# Patient Record
Sex: Female | Born: 1985 | Race: White | Hispanic: No | Marital: Married | Smoking: Never smoker
Health system: Southern US, Community
[De-identification: ages and names within clinical notes are randomized; demographics above are authoritative.]

## PROBLEM LIST (undated history)

## (undated) DIAGNOSIS — F32A Depression, unspecified: Secondary | ICD-10-CM

## (undated) DIAGNOSIS — L709 Acne, unspecified: Secondary | ICD-10-CM

## (undated) DIAGNOSIS — Z23 Encounter for immunization: Secondary | ICD-10-CM

## (undated) DIAGNOSIS — IMO0001 Reserved for inherently not codable concepts without codable children: Secondary | ICD-10-CM

## (undated) DIAGNOSIS — F988 Other specified behavioral and emotional disorders with onset usually occurring in childhood and adolescence: Secondary | ICD-10-CM

## (undated) DIAGNOSIS — O139 Gestational [pregnancy-induced] hypertension without significant proteinuria, unspecified trimester: Secondary | ICD-10-CM

## (undated) DIAGNOSIS — F329 Major depressive disorder, single episode, unspecified: Secondary | ICD-10-CM

## (undated) DIAGNOSIS — M5124 Other intervertebral disc displacement, thoracic region: Secondary | ICD-10-CM

## (undated) HISTORY — DX: Reserved for inherently not codable concepts without codable children: IMO0001

## (undated) HISTORY — DX: Major depressive disorder, single episode, unspecified: F32.9

## (undated) HISTORY — DX: Other specified behavioral and emotional disorders with onset usually occurring in childhood and adolescence: F98.8

## (undated) HISTORY — DX: Encounter for immunization: Z23

## (undated) HISTORY — PX: NO PAST SURGERIES: SHX2092

## (undated) HISTORY — DX: Acne, unspecified: L70.9

## (undated) HISTORY — DX: Other intervertebral disc displacement, thoracic region: M51.24

## (undated) HISTORY — DX: Depression, unspecified: F32.A

---

## 2005-11-02 DIAGNOSIS — M5124 Other intervertebral disc displacement, thoracic region: Secondary | ICD-10-CM

## 2005-11-02 HISTORY — DX: Other intervertebral disc displacement, thoracic region: M51.24

## 2006-01-18 ENCOUNTER — Ambulatory Visit: Payer: Self-pay | Admitting: Unknown Physician Specialty

## 2010-04-11 ENCOUNTER — Ambulatory Visit: Payer: Self-pay | Admitting: Sports Medicine

## 2012-02-01 ENCOUNTER — Encounter (HOSPITAL_COMMUNITY): Payer: Self-pay | Admitting: Emergency Medicine

## 2012-02-01 ENCOUNTER — Emergency Department (HOSPITAL_COMMUNITY)
Admission: EM | Admit: 2012-02-01 | Discharge: 2012-02-02 | Disposition: A | Discharge status: Home / Self Care | Payer: 59 | Attending: Emergency Medicine | Admitting: Emergency Medicine

## 2012-02-01 DIAGNOSIS — L255 Unspecified contact dermatitis due to plants, except food: Secondary | ICD-10-CM

## 2012-02-01 DIAGNOSIS — L259 Unspecified contact dermatitis, unspecified cause: Secondary | ICD-10-CM | POA: Insufficient documentation

## 2012-02-01 NOTE — ED Notes (Signed)
PT. REPORTS ITCHY RASH AT RIGHT FACE AND RIGHT HAND ONSET YESTERDAY AFTER WORKING AT [employer]

## 2012-02-02 MED ORDER — PREDNISONE 20 MG PO TABS
60.0000 mg | ORAL_TABLET | Freq: Once | ORAL | Status: AC
  Administered 2012-02-02: 60 mg via ORAL
  Filled 2012-02-02: qty 3

## 2012-02-02 MED ORDER — PREDNISONE 20 MG PO TABS: 40.0000 mg | ORAL_TABLET | Freq: Every day | ORAL | Status: AC

## 2012-02-02 MED ORDER — DIPHENHYDRAMINE HCL 25 MG PO CAPS
50.0000 mg | ORAL_CAPSULE | Freq: Once | ORAL | Status: AC
  Administered 2012-02-02: 50 mg via ORAL
  Filled 2012-02-02: qty 2

## 2012-02-02 MED ORDER — DIPHENHYDRAMINE HCL 50 MG/ML IJ SOLN: 50.0000 mg | Freq: Once | INTRAMUSCULAR | Status: DC

## 2012-02-02 MED ORDER — FAMOTIDINE 20 MG PO TABS
20.0000 mg | ORAL_TABLET | Freq: Once | ORAL | Status: AC
  Administered 2012-02-02: 20 mg via ORAL
  Filled 2012-02-02: qty 1

## 2012-02-02 NOTE — Discharge Instructions (Signed)
Please review the instructions below. You were evaluated in the emergency department tonight for the rash and itching associated with your recent exposure to see the poison oak/ ivy. We have started your treatment here with Prednisone, Pepcid and Benadryl. You'll continue the Prednisone for a week as directed. You will need to get over-the-counter Pepcid and take 20 mg a day twice a day for 5 days. Get over-the-counter Benadryl and take 25-50 and a mg every 4-6 hours as needed for itching. Try to take the Benadryl as regularly as possible over the next 2-3 days. If you develop worsening symptoms, especially shortness of breath,  wheezing or significant swelling please return to emergency department. Otherwise, we have provided the "[facility]" number above and the resource list below to assist you in getting established with a primary care physician.    Contact Dermatitis Contact dermatitis is a reaction to certain substances that touch the skin. Contact dermatitis can be either irritant contact dermatitis or allergic contact dermatitis. Irritant contact dermatitis does not require previous exposure to the substance for a reaction to occur.Allergic contact dermatitis only occurs if you have been exposed to the substance before. Upon a repeat exposure, your body reacts to the substance.  CAUSES  Many substances can cause contact dermatitis. Irritant dermatitis is most commonly caused by repeated exposure to mildly irritating substances, such as:  Makeup.   Soaps.   Detergents.   Bleaches.   Acids.   Metal salts, such as nickel.  Allergic contact dermatitis is most commonly caused by exposure to:  Poisonous plants.   Chemicals (deodorants, shampoos).   Jewelry.   Latex.   Neomycin in triple antibiotic cream.   Preservatives in products, including clothing.  SYMPTOMS  The area of skin that is exposed may develop:  Dryness or flaking.   Redness.   Cracks.   Itching.    Pain or a burning sensation.   Blisters.  With allergic contact dermatitis, there may also be swelling in areas such as the eyelids, mouth, or genitals.  DIAGNOSIS  Your caregiver can usually tell what the problem is by doing a physical exam. In cases where the cause is uncertain and an allergic contact dermatitis is suspected, a patch skin test may be performed to help determine the cause of your dermatitis. TREATMENT Treatment includes protecting the skin from further contact with the irritating substance by avoiding that substance if possible. Barrier creams, powders, and gloves may be helpful. Your caregiver may also recommend:  Steroid creams or ointments applied 2 times daily. For best results, soak the rash area in cool water for 20 minutes. Then apply the medicine. Cover the area with a plastic wrap. You can store the steroid cream in the refrigerator for a "chilly" effect on your rash. That may decrease itching. Oral steroid medicines may be needed in more severe cases.   Antibiotics or antibacterial ointments if a skin infection is present.   Antihistamine lotion or an antihistamine taken by mouth to ease itching.   Lubricants to keep moisture in your skin.   Burow's solution to reduce redness and soreness or to dry a weeping rash. Mix one packet or tablet of solution in 2 cups cool water. Dip a clean washcloth in the mixture, wring it out a bit, and put it on the affected area. Leave the cloth in place for 30 minutes. Do this as often as possible throughout the day.   Taking several cornstarch or baking soda baths daily if the area  is too large to cover with a washcloth.  Harsh chemicals, such as alkalis or acids, can cause skin damage that is like a burn. You should flush your skin for 15 to 20 minutes with cold water after such an exposure. You should also seek immediate medical care after exposure. Bandages (dressings), antibiotics, and pain medicine may be needed for severely  irritated skin.  HOME CARE INSTRUCTIONS  Avoid the substance that caused your reaction.   Keep the area of skin that is affected away from hot water, soap, sunlight, chemicals, acidic substances, or anything else that would irritate your skin.   Do not scratch the rash. Scratching may cause the rash to become infected.   You may take cool baths to help stop the itching.   Only take over-the-counter or prescription medicines as directed by your caregiver.   See your caregiver for follow-up care as directed to make sure your skin is healing properly.  SEEK MEDICAL CARE IF:   Your condition is not better after 3 days of treatment.   You seem to be getting worse.   You see signs of infection such as swelling, tenderness, redness, soreness, or warmth in the affected area.   You have any problems related to your medicines.  Document Released: 10/16/2000 Document Revised: 10/08/2011 Document Reviewed: 03/24/2011 [facility] Patient Information 2012 [place], [place].Poison [facility] is an inflammation of the skin (contact dermatitis). It is caused by contact with the allergens on the leaves of the oak (toxicodendron) plants. Depending on your sensitivity, the rash may consist simply of redness and itching, or it may also progress to blisters which may break open (rupture). These must be well cared for to prevent secondary germ (bacterial) infection as these infections can lead to scarring. The eyes may also get puffy. The puffiness is worst in the morning and gets better as the day progresses. Healing is best accomplished by keeping any open areas dry, clean, covered with a bandage, and covered with an antibacterial ointment if needed. Without secondary infection, this dermatitis usually heals without scarring within 2 to 3 weeks without treatment. HOME CARE INSTRUCTIONS When you have been exposed to poison oak, it is very important to thoroughly wash with soap and water as soon as the exposure  has been discovered. You have about one half hour to remove the plant resin before it will cause the rash. This cleaning will quickly destroy the oil or antigen on the skin (the antigen is what causes the rash). Wash aggressively under the fingernails as any plant resin still there will continue to spread the rash. Do not rub skin vigorously when washing affected area. Poison oak cannot spread if no oil from the plant remains on your body. Rash that has progressed to weeping sores (lesions) will not spread the rash unless you have not washed thoroughly. It is also important to clean any clothes you have been wearing as they may carry active allergens which will spread the rash, even several days later. Avoidance of the plant in the future is the best measure. Poison oak plants can be recognized by the number of leaves. Generally, poison oak has three leaves with flowering branches on a single stem. Diphenhydramine may be purchased over the counter and used as needed for itching. Do not drive with this medication if it makes you drowsy. Ask your caregiver about medication for children. SEEK IMMEDIATE MEDICAL CARE IF:   Open areas of the rash develop.   You notice redness extending  beyond the area of the rash.   There is a pus like discharge.   There is increased pain.   Other signs of infection develop (such as fever).  Document Released: 04/25/2003 Document Revised: 10/08/2011 Document Reviewed: 09/04/2009 [facility] Patient Information 2012 [place], [place].

## 2012-02-02 NOTE — ED Provider Notes (Signed)
Medical screening examination/treatment/procedure(s) were performed by non-physician practitioner and as supervising physician I was immediately available for consultation/collaboration.   Malcome Ambrocio, MD 02/02/12 0843 

## 2012-02-02 NOTE — ED Provider Notes (Signed)
History     CSN: [id number]  Arrival date & time 02/01/12  2206   First MD Initiated Contact with Patient 02/02/12 [phone]      Chief Complaint  Patient presents with  . Rash    HPI: Patient is a 26 y.o. female presenting with rash. The history is provided by the patient.  Rash  This is a new problem. The problem is associated with plant contact. There has been no fever. The rash is present on the face, left hand, left wrist, right wrist and right hand. The pain is at a severity of 0/10. The patient is experiencing no pain. Associated symptoms include itching. Pertinent negatives include no blisters and no weeping. She has tried antihistamines for the symptoms. The treatment provided no relief.  Patient reports that Saturday she was working in a lot of weeds in [place]. Later she was told that the area had lots of poison oak. Sunday morning she started with a fine rash to bilateral hands and right neck. Rash has slowly worsened and now covers most of the right side of the face with mild swelling noted to the right eyelid. States rash is very itchy. Denies shortness of breath for the symptoms. States she has tried Benadryl for the itching but it does not seem to help.  History reviewed. No pertinent past medical history.  History reviewed. No pertinent past surgical history.  No family history on file.  History  Substance Use Topics  . Smoking status: Never Smoker   . Smokeless tobacco: Not on file  . Alcohol Use: Yes    OB History    Grav Para Term Preterm Abortions TAB SAB Ect Mult Living                  Review of Systems  Constitutional: Negative.   HENT: Negative.   Eyes: Negative.   Respiratory: Negative.   Cardiovascular: Negative.   Gastrointestinal: Negative.   Genitourinary: Negative.   Musculoskeletal: Negative.   Skin: Positive for itching and rash.  Neurological: Negative.   Hematological: Negative.   Psychiatric/Behavioral: Negative.     Allergies  Review  of patient's allergies indicates no known allergies.  Home Medications   Current Outpatient Rx  Name Route Sig Dispense Refill  . DIPHENHYDRAMINE HCL 25 MG PO CAPS Oral Take 25 mg by mouth daily as needed. For itching ( poison oak outbreak)      BP 113/74  Pulse 54  Temp(Src) 98.1 F (36.7 C) (Oral)  Resp 16  SpO2 100%  LMP 01/28/2012  Physical Exam  Constitutional: She is oriented to person, place, and time. She appears well-developed and well-nourished.  HENT:  Head: Normocephalic and atraumatic.  Eyes: Conjunctivae are normal.  Neck: Neck supple.  Cardiovascular: Normal rate and regular rhythm.   Pulmonary/Chest: Effort normal and breath sounds normal.  Abdominal: Soft. Bowel sounds are normal.  Musculoskeletal: Normal range of motion.  Neurological: She is alert and oriented to person, place, and time.  Skin: Skin is warm and dry. Rash noted. No erythema.       Noted dense, erythematous, papular rash to face (predominantly right side of the face and right neck w/ mild swelling noted to (R) eyelid) and bil hands.  Psychiatric: She has a normal mood and affect.    ED Course  Procedures   Labs Reviewed - No data to display No results found.   No diagnosis found.    MDM  HPI/PE and clinical findings c/w 1. Contact  dermatitis (Likely urushiol-induced)        Leanne Chang, NP 02/02/12 [phone]

## 2014-02-15 LAB — HM PAP SMEAR: HM PAP: NEGATIVE

## 2016-06-05 ENCOUNTER — Telehealth: Payer: Self-pay | Admitting: Primary Care

## 2016-06-05 ENCOUNTER — Encounter: Payer: Self-pay | Admitting: Primary Care

## 2016-06-05 ENCOUNTER — Ambulatory Visit (INDEPENDENT_AMBULATORY_CARE_PROVIDER_SITE_OTHER): Payer: No Typology Code available for payment source | Admitting: Primary Care

## 2016-06-05 VITALS — BP 124/76 | HR 56 | Temp 98.1°F | Ht 66.5 in | Wt 161.1 lb

## 2016-06-05 DIAGNOSIS — R41840 Attention and concentration deficit: Secondary | ICD-10-CM | POA: Diagnosis not present

## 2016-06-05 DIAGNOSIS — F909 Attention-deficit hyperactivity disorder, unspecified type: Secondary | ICD-10-CM | POA: Insufficient documentation

## 2016-06-05 NOTE — Patient Instructions (Signed)
You will be contacted regarding your referral to Psychology for ADD/ADHD testing.  Please let us know if you have not heard back in 3 weeks.  Once you've been tested please e-mail me so I can get in touch with the psychologist. We will then meet back together to discuss treatment options.  It was a pleasure to meet you today! Please don't hesitate to call me with any questions. Welcome to [facility]!

## 2016-06-05 NOTE — Progress Notes (Signed)
   Subjective:    Patient ID: Lynn Bean, female    DOB: [birth year], 30 y.o.   MRN: [id number]  HPI  Lynn Bean is a 30 year old female who presents today to establish care and discuss the problems mentioned below. Will obtain old records. She will see GYN annually.   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 [employer] will require her to study and take numerous exams.   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 She was once managed on Ritalin in the morning and Focalin in the afternoon. She has been without her medication for the past 6 years as she didn't require it any longer. She's had no recent formal testing.  She struggles with focusing, staying on task, and cannot comprehend what she's studying which will cause frustration. Denies anxiety, daily worry, irritability. She would like to resume medication for the next 1-2 years.  Review of Systems  Respiratory: Negative for shortness of breath.   Cardiovascular: Negative for chest pain and palpitations.  Psychiatric/Behavioral: Positive for decreased concentration. Negative for sleep disturbance. The patient is not nervous/anxious.        Past Medical History:  Diagnosis Date  . Ruptured disc, thoracic 2007     Social History   Social History  . Marital status: Single    Spouse name: N/A  . Number of children: N/A  . Years of education: N/A   Occupational History  . Not on file.   Social History Main Topics  . Smoking status: Never Smoker  . Smokeless tobacco: Not on file  . Alcohol use Yes  . Drug use: No  . Sexual activity: Not on file   Other Topics Concern  . Not on file   Social History Narrative   Single.   No children.   Works for [employer].   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   Graduated from [employer].   Enjoys playing golf, exercising.     No past  surgical history on file.  Family History  Problem Relation Age of Onset  . Lymphoma Maternal Grandfather     No Known Allergies  No current outpatient prescriptions on file prior to visit.   No current facility-administered medications on file prior to visit.     BP 124/76   Pulse (!) 56   Temp 98.1 F (36.7 C) (Oral)   Ht 5' 6.5" (1.689 m)   Wt 161 lb 1.9 oz (73.1 kg)   LMP 05/16/2016   SpO2 98%   BMI 25.62 kg/m    Objective:   Physical Exam  Constitutional: She appears well-nourished.  Neck: Neck supple.  Cardiovascular: Normal rate and regular rhythm.   Pulmonary/Chest: Effort normal and breath sounds normal.  Skin: Skin is warm and dry.  Psychiatric: She has a normal mood and affect.          Assessment & Plan:

## 2016-06-05 NOTE — Assessment & Plan Note (Signed)
History of ADD, inattentive in high school and managed on Ritalin and Focalin. No recent testing or medication. Referral placed for formal testing through psychology. Discussed process with patient who verbalized understanding. Will await testing results.

## 2016-06-05 NOTE — Progress Notes (Signed)
Pre visit review using our clinic review tool, if applicable. No additional management support is needed unless otherwise documented below in the visit note. 

## 2016-06-05 NOTE — Telephone Encounter (Signed)
Pt placed on [facility] WQ. Pt is aware someone will call her to schedule with Dr. Lurline Hare or Dr. Glennon Hamilton

## 2016-07-10 ENCOUNTER — Ambulatory Visit: Payer: No Typology Code available for payment source | Admitting: Psychology

## 2016-08-24 ENCOUNTER — Encounter: Payer: Self-pay | Admitting: Primary Care

## 2016-08-25 ENCOUNTER — Ambulatory Visit (INDEPENDENT_AMBULATORY_CARE_PROVIDER_SITE_OTHER): Payer: No Typology Code available for payment source | Admitting: Primary Care

## 2016-08-25 ENCOUNTER — Encounter: Payer: Self-pay | Admitting: Primary Care

## 2016-08-25 VITALS — BP 122/78 | HR 78 | Temp 98.5°F | Ht 66.5 in | Wt 144.1 lb

## 2016-08-25 DIAGNOSIS — H9201 Otalgia, right ear: Secondary | ICD-10-CM | POA: Diagnosis not present

## 2016-08-25 NOTE — Progress Notes (Signed)
Pre visit review using our clinic review tool, if applicable. No additional management support is needed unless otherwise documented below in the visit note. 

## 2016-08-25 NOTE — Progress Notes (Signed)
   Subjective:    Patient ID: Lynn Bean, female    DOB: July 15, 1986, 30 y.o.   MRN: [id number]  HPI  Lynn Bean is a 30 year old female who presents today with a chief complaint of ear pain. Her pain is located to the right ear which has been present since Wednesday last week. She describes her pain as pressure. The pain has started to radiate down her right jaw and became worse yesterday. Denies cough, sore throat, fevers. She's taken tylenol and Advil without much improvement.   Review of Systems  Constitutional: Negative for chills and fever.  HENT: Positive for ear pain. Negative for congestion, postnasal drip, sinus pressure and sore throat.   Respiratory: Negative for cough.        Past Medical History:  Diagnosis Date  . Ruptured disc, thoracic 2007     Social History   Social History  . Marital status: Single    Spouse name: N/A  . Number of children: N/A  . Years of education: N/A   Occupational History  . Not on file.   Social History Main Topics  . Smoking status: Never Smoker  . Smokeless tobacco: Not on file  . Alcohol use Yes  . Drug use: No  . Sexual activity: Not on file   Other Topics Concern  . Not on file   Social History Narrative   Single.   No children.   Works for [employer].   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   Graduated from [employer].   Enjoys playing golf, exercising.     No past surgical history on file.  Family History  Problem Relation Age of Onset  . Lymphoma Maternal Grandfather     No Known Allergies  No current outpatient prescriptions on file prior to visit.   No current facility-administered medications on file prior to visit.     BP 122/78   Pulse 78   Temp 98.5 F (36.9 C) (Oral)   Ht 5' 6.5" (1.689 m)   Wt 144 lb 1.9 oz (65.4 kg)   LMP 08/02/2016   SpO2 98%   BMI 22.91 kg/m    Objective:   Physical Exam  Constitutional: She appears well-nourished.  HENT:  Right Ear: Ear canal  normal. Tympanic membrane is not erythematous and not bulging.  Left Ear: Ear canal normal. Tympanic membrane is not erythematous and not bulging.  Nose: Right sinus exhibits no maxillary sinus tenderness and no frontal sinus tenderness. Left sinus exhibits no maxillary sinus tenderness and no frontal sinus tenderness.  Mouth/Throat: Oropharynx is clear and moist.  Dullness to TM's bilaterally, mild effusion to right TM. No s/s of acute infection  Eyes: Conjunctivae are normal.  Neck: Neck supple.  Cardiovascular: Normal rate and regular rhythm.   Pulmonary/Chest: Effort normal and breath sounds normal. She has no wheezes. She has no rales.  Lymphadenopathy:    She has no cervical adenopathy.  Skin: Skin is warm and dry.          Assessment & Plan:  Ear Pain:  Located to right ear x 6 days. Little to no improvement with tylenol or ibuprofen. Exam today with dullness bilaterally and mild effusion to right TM. Does not appear acutely infected. ENT exam otherwise unremarkable. Suspect allergy involvement and will treat conservatively.  Will have her start Zyrtec and Flonase, report back if no improvement.  Sheral Flow, NP

## 2016-08-25 NOTE — Patient Instructions (Signed)
There is no evidence of infection or ear wax buildup. You do have fluid behind the ear drum.  Start an antihistamine such as Claritin, Allegra, or Zyrtec. Take this once daily for the next 2-3 weeks.  Try using Flonase (fluticasone) nasal spray. Instill 1 spray in each nostril twice daily.   You may continue Advil as this will help to reduce any inflammation/pain.  Please e-mail me if symptoms progress, if you develop fevers, nausea, sore throat.  It was a pleasure to see you today!

## 2016-08-28 ENCOUNTER — Ambulatory Visit (INDEPENDENT_AMBULATORY_CARE_PROVIDER_SITE_OTHER): Payer: No Typology Code available for payment source | Admitting: Psychology

## 2016-08-28 DIAGNOSIS — F909 Attention-deficit hyperactivity disorder, unspecified type: Secondary | ICD-10-CM

## 2016-08-28 DIAGNOSIS — F902 Attention-deficit hyperactivity disorder, combined type: Secondary | ICD-10-CM

## 2016-08-28 DIAGNOSIS — F4010 Social phobia, unspecified: Secondary | ICD-10-CM

## 2016-08-28 DIAGNOSIS — F4321 Adjustment disorder with depressed mood: Secondary | ICD-10-CM

## 2016-08-28 DIAGNOSIS — F419 Anxiety disorder, unspecified: Secondary | ICD-10-CM

## 2016-09-01 ENCOUNTER — Encounter: Payer: Self-pay | Admitting: Primary Care

## 2016-09-01 ENCOUNTER — Telehealth: Payer: Self-pay | Admitting: Primary Care

## 2016-09-01 NOTE — Telephone Encounter (Signed)
-----  Message from Terrace Arabia, PhD sent at 09/01/2016  9:50 AM EDT ----- Regarding: RE: ADHD Yes I'd go with the nonstimulant first due to the high processing speed and anxiety.  Marland Kitchen   ----- Message ----- From: Pleas Koch, NP Sent: 08/31/2016   2:07 PM To: Terrace Arabia, PhD Subject: RE: ADHD                                       Great! Thanks!  Just to clarify, you recommend a non-stimulant treatment option first, correct?  I always prefer non-stimulant treatment if it is indicated.  Happy Monday! Allie Bossier, NP-C  ----- Message ----- From: Terrace Arabia, PhD Sent: 08/31/2016  12:59 PM To: Pleas Koch, NP Subject: RE: ADHD                                       Liam Rogers,   I met with Ms. Hopf last Friday.  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  Attention deficits were mild at this time with a very high processing speed so we discussed treading very slowly with stimulant medication and considering non-stimulant ADHD medication as well.  Report to follow.  Randel Pigg, Ph.D.        ----- Message ----- From: Pleas Koch, NP Sent: 08/31/2016   8:02 AM To: Terrace Arabia, PhD Subject: ADHD                                           Hi Dr. Lurline Hare,  I believe you met with Ms. Giarratano last week. Do you have any preliminary results? She's already e-mailing me requesting treatment.  Thanks! Allie Bossier, NP-C

## 2016-09-02 ENCOUNTER — Other Ambulatory Visit: Payer: Self-pay | Admitting: Primary Care

## 2016-09-02 DIAGNOSIS — F900 Attention-deficit hyperactivity disorder, predominantly inattentive type: Secondary | ICD-10-CM

## 2016-09-02 DIAGNOSIS — F902 Attention-deficit hyperactivity disorder, combined type: Secondary | ICD-10-CM

## 2016-09-02 MED ORDER — BUPROPION HCL ER (XL) 150 MG PO TB24
150.0000 mg | ORAL_TABLET | Freq: Every day | ORAL | 1 refills | Status: DC
Start: 2016-09-02 — End: 2016-09-02

## 2016-09-02 MED ORDER — BUPROPION HCL ER (XL) 150 MG PO TB24: 150.0000 mg | ORAL_TABLET | Freq: Every day | ORAL | 1 refills | Status: DC

## 2016-09-08 ENCOUNTER — Encounter: Payer: Self-pay | Admitting: Primary Care

## 2016-09-23 ENCOUNTER — Telehealth: Payer: Self-pay | Admitting: Primary Care

## 2016-09-23 NOTE — Telephone Encounter (Signed)
Pt called stating she spoke to [place] about switching provders from [place].  She also said you gave her two options and she has not heard from anyone  Best number [phone]

## 2016-09-30 NOTE — Telephone Encounter (Signed)
Spoke with patient and explained that we do not have a provider at [facility] that will take her as a transfer.  She expressed understanding.  Removed Lynn Bean as PCP. Messaged providers at [employer] station for transfer to that office.

## 2016-10-01 NOTE — Telephone Encounter (Signed)
Left messages on 09/30/16 and today on pt cell that Dr. Lacinda Axon and Kathrin Penner have agreed to transfer pt to the [facility] office.

## 2016-10-28 ENCOUNTER — Ambulatory Visit (INDEPENDENT_AMBULATORY_CARE_PROVIDER_SITE_OTHER): Payer: No Typology Code available for payment source | Admitting: Family Medicine

## 2016-10-28 ENCOUNTER — Encounter: Payer: Self-pay | Admitting: Family Medicine

## 2016-10-28 DIAGNOSIS — F902 Attention-deficit hyperactivity disorder, combined type: Secondary | ICD-10-CM

## 2016-10-28 MED ORDER — AMPHETAMINE-DEXTROAMPHET ER 20 MG PO CP24: 20.0000 mg | ORAL_CAPSULE | ORAL | 0 refills | Status: DC

## 2016-10-28 NOTE — Progress Notes (Signed)
Subjective:  Patient ID: Lynn Bean, female    DOB: 04-01-1986  Age: 30 y.o. MRN: [id number]  CC: Establish with me, ADHD  HPI:  30 year old female presents to establish care with me. Her current concern is regarding difficulty concentrating/ADHD.  ADHD  Long-standing history of ADHD. Was not diagnosed until college.  She states that she did well on medication while she was in college and doing postgraduate work.  She has been off medication for quite some time.  Her previous primary arranged for psychological testing.  Assessment revealed findings consistent with ADHD, combined. There were also concerns regarding social anxiety, adjustment disorder with depressed mood as well as possible mood disorder.  Patient states that the evaluation was done around the time she broke up with her boyfriend. She states that this accounts for her mood lability at that time.  Patient was started on Wellbutrin by her former primary and has had no improvement.  She endorses compliance.  She states that she is under a great deal of stress as of late. She is starting a new career and is having to take several exams. There also stressors at work. She continues to have difficulty concentrating and staying on task.  She would like to discuss treatment options today.  Social Hx   Social History   Social History  . Marital status: Single    Spouse name: N/A  . Number of children: N/A  . Years of education: N/A   Social History Main Topics  . Smoking status: Never Smoker  . Smokeless tobacco: None  . Alcohol use Yes  . Drug use: No  . Sexual activity: Not Asked   Other Topics Concern  . None   Social History Narrative   Single.   No children.   Works for [employer].   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   Graduated from [employer].   Enjoys playing golf, exercising.    Review of Systems  Constitutional: Negative.   Psychiatric/Behavioral: Positive for decreased  concentration. Negative for behavioral problems.   Objective:  BP (!) 143/77   Pulse (!) 110   Temp 98.3 F (36.8 C) (Oral)   Resp 12   Ht 5\' 6"  (1.676 m)   Wt 140 lb 8 oz (63.7 kg)   SpO2 98%   BMI 22.68 kg/m   BP/Weight 10/28/2016 [id number]  Systolic BP [id number]  Diastolic BP 77 78 76  Wt. (Lbs) 140.5 144.12 161.12  BMI 22.68 22.91 25.62   Physical Exam  Constitutional: She is oriented to person, place, and time. She appears well-developed. No distress.  Cardiovascular: Normal rate and regular rhythm.   Pulmonary/Chest: Effort normal and breath sounds normal.  Neurological: She is alert and oriented to person, place, and time.  Psychiatric: She has a normal mood and affect. Her behavior is normal. Thought content normal.  Vitals reviewed.  Assessment & Plan:   Problem List Items Addressed This Visit    Attention deficit hyperactivity disorder (ADHD)    Established problem, worsening. I have reviewed her psychological testing. I understand the concern about potential mood disorder. She does not appear to have any signs or symptoms of bipolar disorder. Will monitor her closely while she is on stimulant medication. Treating with Adderall XR 20 mg daily. Follow-up in 3 months.        Meds ordered this encounter  Medications  . FALMINA 0.1-20 MG-MCG tablet    Sig: Take 1 tablet by mouth daily.  Refill:  0  . amphetamine-dextroamphetamine (ADDERALL XR) 20 MG 24 hr capsule    Sig: Take 1 capsule (20 mg total) by mouth every morning.    Dispense:  30 capsule    Refill:  0  . amphetamine-dextroamphetamine (ADDERALL XR) 20 MG 24 hr capsule    Sig: Take 1 capsule (20 mg total) by mouth every morning.    Dispense:  30 capsule    Refill:  0    Do not fill before 11/28/16.  Marland Kitchen amphetamine-dextroamphetamine (ADDERALL XR) 20 MG 24 hr capsule    Sig: Take 1 capsule (20 mg total) by mouth every morning.    Dispense:  30 capsule    Refill:  0    Do not fill before  12/29/16.    Follow-up: 3 months  [place]

## 2016-10-28 NOTE — Progress Notes (Signed)
Pre visit review using our clinic review tool, if applicable. No additional management support is needed unless otherwise documented below in the visit note. 

## 2016-10-28 NOTE — Patient Instructions (Signed)
Call with concerns.  See you in 3 months.  Take care  Dr. Lacinda Axon

## 2016-10-28 NOTE — Assessment & Plan Note (Addendum)
New problem (to Primary care). This has yet to be formally diagnosed and addressed in the EMR. I have reviewed her psychological testing. I understand the concern about potential mood disorder. She does not appear to have any signs or symptoms of bipolar disorder. Will monitor her closely while she is on stimulant medication. Treating with Adderall XR 20 mg daily. Follow-up in 3 months.

## 2016-10-29 ENCOUNTER — Other Ambulatory Visit: Payer: Self-pay | Admitting: Family Medicine

## 2016-10-29 MED ORDER — AMPHETAMINE-DEXTROAMPHETAMINE 10 MG PO TABS: 10.0000 mg | ORAL_TABLET | Freq: Two times a day (BID) | ORAL | 0 refills | Status: DC

## 2017-01-25 ENCOUNTER — Other Ambulatory Visit: Payer: Self-pay | Admitting: Family Medicine

## 2017-01-25 ENCOUNTER — Other Ambulatory Visit: Payer: Self-pay | Admitting: Obstetrics and Gynecology

## 2017-01-25 ENCOUNTER — Encounter: Payer: Self-pay | Admitting: Family Medicine

## 2017-01-25 ENCOUNTER — Telehealth: Payer: Self-pay | Admitting: Obstetrics and Gynecology

## 2017-01-25 MED ORDER — AMPHETAMINE-DEXTROAMPHETAMINE 10 MG PO TABS: 10.0000 mg | ORAL_TABLET | Freq: Two times a day (BID) | ORAL | 0 refills | Status: DC

## 2017-01-25 MED ORDER — LEVONORGESTREL-ETHINYL ESTRAD 0.1-20 MG-MCG PO TABS: 1.0000 | ORAL_TABLET | Freq: Every day | ORAL | 0 refills | Status: DC

## 2017-01-25 NOTE — Telephone Encounter (Signed)
Pt is schedule 5/73/22 with El[facility] for annual. Pt needs an Refill on her medications.

## 2017-01-25 NOTE — Telephone Encounter (Signed)
Done

## 2017-01-25 NOTE — Telephone Encounter (Signed)
Refilled: 10/29/16. Last OV: 10/28/16 Last Labs: none Future OV: none Please advise?

## 2017-01-26 ENCOUNTER — Other Ambulatory Visit: Payer: Self-pay | Admitting: Family Medicine

## 2017-01-26 ENCOUNTER — Ambulatory Visit: Payer: Self-pay | Admitting: Family Medicine

## 2017-01-26 MED ORDER — AMPHETAMINE-DEXTROAMPHETAMINE 10 MG PO TABS: 10.0000 mg | ORAL_TABLET | Freq: Two times a day (BID) | ORAL | 0 refills | Status: DC

## 2017-02-11 ENCOUNTER — Other Ambulatory Visit: Payer: Self-pay

## 2017-02-11 DIAGNOSIS — Z308 Encounter for other contraceptive management: Secondary | ICD-10-CM

## 2017-02-11 MED ORDER — LEVONORGESTREL-ETHINYL ESTRAD 0.1-20 MG-MCG PO TABS: 1.0000 | ORAL_TABLET | Freq: Every day | ORAL | 0 refills | Status: DC

## 2017-02-11 MED ORDER — LEVONORGESTREL-ETHINYL ESTRAD 0.1-20 MG-MCG PO TABS: 1.0000 | ORAL_TABLET | Freq: Every day | ORAL | 11 refills | Status: DC

## 2017-03-09 ENCOUNTER — Encounter: Payer: Self-pay | Admitting: Family Medicine

## 2017-03-11 ENCOUNTER — Encounter: Payer: Self-pay | Admitting: Obstetrics and Gynecology

## 2017-03-15 ENCOUNTER — Encounter: Payer: Self-pay | Admitting: Obstetrics and Gynecology

## 2017-03-16 NOTE — Progress Notes (Signed)
This encounter was created in error - please disregard.

## 2017-03-22 ENCOUNTER — Telehealth: Payer: Self-pay | Admitting: Family Medicine

## 2017-03-22 ENCOUNTER — Encounter: Payer: Self-pay | Admitting: Family Medicine

## 2017-03-22 ENCOUNTER — Other Ambulatory Visit: Payer: Self-pay | Admitting: Family Medicine

## 2017-03-22 MED ORDER — AMPHETAMINE-DEXTROAMPHETAMINE 10 MG PO TABS: 10.0000 mg | ORAL_TABLET | Freq: Two times a day (BID) | ORAL | 0 refills | Status: DC

## 2017-03-22 NOTE — Telephone Encounter (Signed)
Pt called to follow up on the Rx. Please advise? Thank you!

## 2017-03-22 NOTE — Telephone Encounter (Signed)
Ok to refill 

## 2017-03-22 NOTE — Telephone Encounter (Signed)
Can pick up rx. Will need visit in 3 months.

## 2017-03-22 NOTE — Telephone Encounter (Signed)
Pt came in today. She would like to have her amphetamine-dextroamphetamine (ADDERALL) 10 MG tablet(Expired) refilled. Please call when it is ready for pick up. [phone].

## 2017-03-23 ENCOUNTER — Encounter: Payer: Self-pay | Admitting: Family Medicine

## 2017-03-23 ENCOUNTER — Other Ambulatory Visit: Payer: Self-pay | Admitting: Family Medicine

## 2017-03-23 NOTE — Telephone Encounter (Signed)
Pt Rx has been placed up front for pickup.

## 2017-03-23 NOTE — Telephone Encounter (Signed)
Left voice mail to call back placed script up front to pick up.  See message below will need to schedule office visit in 3 months.

## 2017-03-23 NOTE — Telephone Encounter (Signed)
Pt sent a voicemail regarding being seen every 3 months. Rx has been placed up front & mychart message was routed to Dr. Lacinda Axon this morning.

## 2017-03-24 ENCOUNTER — Telehealth: Payer: Self-pay | Admitting: Family Medicine

## 2017-03-24 NOTE — Telephone Encounter (Signed)
Pt picked up her prescription for amphetamine-dextroamphetamine (ADDERALL) 10 MG tablet on 03/24/17. Pt stated she did not have time to make an appointment, she will call by phone to schedule appt.

## 2017-05-11 ENCOUNTER — Telehealth: Payer: Self-pay

## 2017-05-11 NOTE — Telephone Encounter (Signed)
Pt can request the same generic pills she was on before from pharmacy. RN to notify pt.

## 2017-05-11 NOTE — Telephone Encounter (Signed)
Pt called after hour nurse last night and called this am.  She has been getting her bcp from [place] and then when [facility] took over bcp generic was changed.  Pt now has sun spots all over her face esp on sides. Pt states she uses #70 sunscreen every day.  Nothing has changed except the new pills.  Pharm sugg it was pills - maybe an inactive ingredient. Can pills be changed or what can be done?

## 2017-05-11 NOTE — Telephone Encounter (Signed)
Pt is calling to find out about reaction she is having to her Birthcontrol . Please call patient

## 2017-05-11 NOTE — Telephone Encounter (Signed)
This is duplicate msg (probably since called last night and again today). Message already routed to [facility], CMA this morning.

## 2017-05-11 NOTE — Telephone Encounter (Signed)
Pt called after hours nurse triage line last night c/o medication reaction.   Note from triage:  "Caller is taking birth control and pharmacy switched generic brands. She has been getting sun spots on face since. She states that the pharmacist told her that it is highly likely that there is something in the new brand of the [facility] pills that she takes that is causing the spots on her face. She is wanting to have her birth controls pills changed to something else. She is currently on Falmina 28."  Please advise. Thank you.

## 2017-05-12 NOTE — Telephone Encounter (Signed)
Pt aware.

## 2017-05-13 ENCOUNTER — Ambulatory Visit: Payer: Self-pay | Admitting: Obstetrics and Gynecology

## 2017-07-06 ENCOUNTER — Telehealth: Payer: Self-pay

## 2017-07-06 NOTE — Telephone Encounter (Signed)
Pt requesting samples of nuvaring thru Dec b/c [facility] has rx all out of wack since they bought out [employer].  She is also switching ins in Dec so it will be taken care of.  [phone]

## 2017-07-06 NOTE — Telephone Encounter (Signed)
That's fine. Thx.

## 2017-07-06 NOTE — Telephone Encounter (Signed)
Pt aware nuvaring samples are up front ready for p/u - just ask receptionist.

## 2017-07-08 ENCOUNTER — Ambulatory Visit: Payer: Self-pay | Admitting: Obstetrics and Gynecology

## 2017-08-16 ENCOUNTER — Ambulatory Visit: Payer: Self-pay | Admitting: Obstetrics and Gynecology

## 2017-10-04 ENCOUNTER — Ambulatory Visit: Payer: Self-pay | Admitting: Obstetrics and Gynecology

## 2017-12-02 ENCOUNTER — Ambulatory Visit (INDEPENDENT_AMBULATORY_CARE_PROVIDER_SITE_OTHER): Payer: BLUE CROSS/BLUE SHIELD | Admitting: Internal Medicine

## 2017-12-02 ENCOUNTER — Encounter: Payer: Self-pay | Admitting: Internal Medicine

## 2017-12-02 VITALS — BP 120/78 | HR 82 | Temp 98.7°F | Ht 66.0 in | Wt 158.6 lb

## 2017-12-02 DIAGNOSIS — F902 Attention-deficit hyperactivity disorder, combined type: Secondary | ICD-10-CM | POA: Diagnosis not present

## 2017-12-02 MED ORDER — AMPHETAMINE-DEXTROAMPHETAMINE 10 MG PO TABS: 10.0000 mg | ORAL_TABLET | Freq: Two times a day (BID) | ORAL | 0 refills | Status: DC

## 2017-12-02 NOTE — Progress Notes (Signed)
Chief Complaint  Patient presents with  . Establish Care   F/u wants Rx on Adderrall no other complaints  She does decline labs though had not had in a while and declines vaccines flu, Tdap.  She reports stress b/c sister is having pregnancy problems.    Review of Systems  Constitutional: Negative for weight loss.  HENT: Negative for hearing loss.   Eyes:       No vision issues   Respiratory: Negative for shortness of breath.   Cardiovascular: Negative for chest pain.  Gastrointestinal: Negative for abdominal pain.  Musculoskeletal: Negative for falls.  Skin: Negative for rash.  Neurological: Negative for headaches.  Psychiatric/Behavioral:       +stress    Past Medical History:  Diagnosis Date  . Acne   . ADD (attention deficit disorder)   . Depression   . 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 Ruptured disc, thoracic 2007   No past surgical history on file. Family History  Problem Relation Age of Onset  . Lymphoma Maternal Grandfather   . Hypertension Father    Social History   Socioeconomic History  . Marital status: Single    Spouse name: Not on file  . Number of children: Not on file  . Years of education: Not on file  . Highest education level: Not on file  Social Needs  . Financial resource strain: Not on file  . Food insecurity - worry: Not on file  . Food insecurity - inability: Not on file  . Transportation needs - medical: Not on file  . Transportation needs - non-medical: Not on file  Occupational History  . Not on file  Tobacco Use  . Smoking status: Never Smoker  . Smokeless tobacco: Never Used  Substance and Sexual Activity  . Alcohol use: Yes  . Drug use: No  . Sexual activity: Yes  Other Topics Concern  . Not on file  Social History Narrative   Single.   No children.   Works for [employer].   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   Graduated from [employer].   Enjoys  playing golf, exercising.    Current Meds  Medication Sig  . levonorgestrel-ethinyl estradiol (FALMINA) 0.1-20 MG-MCG tablet Take 1 tablet by mouth daily.   No Known Allergies No results found for this or any previous visit (from the past 2160 hour(s)). Objective  Body mass index is 25.6 kg/m. Wt Readings from Last 3 Encounters:  12/02/17 158 lb 9.6 oz (71.9 kg)  10/28/16 140 lb 8 oz (63.7 kg)  08/25/16 144 lb 1.9 oz (65.4 kg)   Temp Readings from Last 3 Encounters:  12/02/17 98.7 F (37.1 C) (Oral)  10/28/16 98.3 F (36.8 C) (Oral)  08/25/16 98.5 F (36.9 C) (Oral)   BP Readings from Last 3 Encounters:  12/02/17 120/78  10/28/16 (!) 143/77  08/25/16 122/78   Pulse Readings from Last 3 Encounters:  12/02/17 82  10/28/16 (!) 110  08/25/16 78   O2 sat room air 98%  Physical Exam  Constitutional: She is oriented to person, place, and time and well-developed, well-nourished, and in no distress. Vital signs are normal.  HENT:  Head: Normocephalic and atraumatic.  Mouth/Throat: Oropharynx is clear and moist and mucous membranes are normal.  Eyes: Conjunctivae are normal. Pupils are equal, round, and reactive to light.  Cardiovascular: Normal rate, regular rhythm and normal heart sounds.  Pulmonary/Chest: Effort normal  and breath sounds normal.  Abdominal: Soft. Bowel sounds are normal.  Neurological: She is alert and oriented to person, place, and time. Gait normal. Gait normal.  Skin: Skin is warm, dry and intact.  Psychiatric: Mood, memory, affect and judgment normal.  Nursing note and vitals reviewed.   Assessment   1. ADHD with noted psych testing 08/28/16 +adjustment d/o with depressed mood and social anxiety d/o  2. HM Plan  1. Refilled Adderral 10 mg bid x 3 months  Had psychological testing 08/28/16  F/u in 3 months disc could do random drug screens   2. Declines all vaccines and blood work  Will get pap 11/6107 with [facility] and states if she  indicates she needs labs will get them then but review of chart no labs and per pt has not had labs in 8 years Pending switch to Nuva Ring 01/2018  Provider: Dr. Olivia Mackie McLean-Scocuzza-Internal Medicine

## 2017-12-02 NOTE — Patient Instructions (Signed)
Take care  Please follow up in 3 months sooner If needed     Attention Deficit Hyperactivity Disorder Attention deficit hyperactivity disorder (ADHD) is a condition that can make it hard for a child to pay attention and concentrate or to control his or her behavior. The child may also have a lot of energy. ADHD is a disorder of the brain (neurodevelopmental disorder), and symptoms are typically first seen in early childhood. It is a common reason for behavioral and academic problems in school. There are three main types of ADHD:  Inattentive. With this type, children have difficulty paying attention.  Hyperactive-impulsive. With this type, children have a lot of energy and have difficulty controlling their behavior.  Combination. This type involves having symptoms of both of the other types.  ADHD is a lifelong condition. If it is not treated, the disorder can affect a child's future academic achievement, employment, and relationships. What are the causes? The exact cause of this condition is not known  What are the signs or symptoms? Symptoms of this condition depend on the type of ADHD. Symptoms are listed here for each type: Inattentive  Problems with organization.  Difficulty staying focused.  Problems completing assignments at school.  Often making simple mistakes.  Problems sustaining mental effort.  Not listening to instructions.  Losing things often.  Forgetting things often.  Being easily distracted. Hyperactive-impulsive  Fidgeting often.  Difficulty sitting still in one's seat.  Talking a lot.  Talking out of turn.  Interrupting others.  Difficulty relaxing or doing quiet activities.  High energy levels and constant movement.  Difficulty waiting.  Always "on the go." Combination  Having symptoms of both of the other types. Children with ADHD may feel frustrated with themselves and may find school to be particularly discouraging. They often  perform below their abilities in school. As children get older, the excess movement can lessen, but the problems with paying attention and staying organized often continue. Most children do not outgrow ADHD, but with good treatment, they can learn to cope with the symptoms. How is this diagnosed? This condition is diagnosed based on a child's symptoms and academic history. The child's health care provider will do a complete assessment. As part of the assessment, the health care provider will ask the child questions and will ask the parents and teachers for their observations of the child. The health care provider looks for specific symptoms of ADHD. Diagnosis will include:  Ruling out other reasons for the child's behavior.  Reviewing behavior rating scales that have been filled out about the child by people who deal with the child on a daily basis.  A diagnosis is made only after all information from multiple people has been considered. How is this treated? Treatment for this condition may include:  Behavior therapy.  Medicines to decrease impulsivity and hyperactivity and to increase attention. Behavior therapy is preferred for children younger than 80 years old. The combination of medicine and behavior therapy is most effective for children older than 88 years of age.  Tutoring or extra support at school.  Techniques for parents to use at home to help manage their child's symptoms and behavior.  Follow these instructions at home: Eating and drinking  Offer your child a well-balanced diet. Breakfast that includes a balance of whole grains, protein, and fruits or vegetables is especially important for school performance.  If your child has trouble with hyperactivity, have your child avoid drinks that contain caffeine. These include: ? Soft drinks. ?  Coffee. ? Tea.  If your child is older and finds that caffeinated drinks help to improve his or her attention, talk with your child's  health care provider about what amount of caffeine intake is a safe for your child. Lifestyle   Make sure your child gets a full night of sleep and regular daily exercise.  Help manage your child's behavior by following the techniques learned in therapy. These may include: ? Looking for good behavior and rewarding it. ? Making rules for behavior that your child can understand and follow. ? Giving clear instructions. ? Responding consistently to your child's challenging behaviors. ? Setting realistic goals. ? Looking for activities that can lead to success and self-esteem. ? Making time for pleasant activities with your child. ? Giving lots of affection.  Help your child learn to be organized. Some ways to do this include: ? Keeping daily schedules the same. Have a regular wake-up time and bedtime for your child. Schedule all activities, including time for homework and time for play. Post the schedule in a place where your child will see it. Mark schedule changes in advance. ? Having a regular place for your child to store items such as clothing, backpacks, and school supplies. ? Encouraging your child to write down school assignments and to bring home needed books. Work with your child's teachers for assistance in organizing school work. General instructions  Learn as much as you can about ADHD. This will improve your ability to help your child and to make sure he or she gets the support needed. It will also help you educate your child's teachers and instructors if they do not feel that they have adequate knowledge or experience in these areas.  Work with your child's teachers to make sure your child gets the support and extra help that is needed. This may include: ? Tutoring. ? Teacher cues to help your child remain on task. ? Seating changes so your child is working at a desk that is free from distractions.  Give over-the-counter and prescription medicines only as told by your child's  health care provider.  Keep all follow-up visits as told by your health care provider. This is important. Contact a health care provider if:  Your child has repeated muscle twitches (tics), coughs, or speech outbursts.  Your child has sleep problems.  Your child has a marked loss of appetite.  Your child develops depression.  Your child has new or worsening behavioral problems.  Your child has dizziness.  Your child has a racing heart.  Your child has stomach pains.  Your child develops headaches. Get help right away if:  Your child talks about or threatens suicide.  You are worried that your child is having a bad reaction to a medicine that he or she is taking for ADHD. This information is not intended to replace advice given to you by your health care provider. Make sure you discuss any questions you have with your health care provider. Document Released: 10/09/2002 Document Revised: 06/17/2016 Document Reviewed: 05/14/2016 Elsevier Interactive Patient Education  [employer].

## 2017-12-02 NOTE — Progress Notes (Signed)
Pre visit review using our clinic review tool, if applicable. No additional management support is needed unless otherwise documented below in the visit note. 

## 2017-12-08 ENCOUNTER — Telehealth: Payer: Self-pay

## 2017-12-08 NOTE — Telephone Encounter (Signed)
Copied from [place] [id number]. Topic: Inquiry >> Dec 07, 2017  5:13 PM Boyd Kerbs wrote: Reason for CRM:   [place] [phone] is trying to fill prescription but is denied as Physician Aundra Dubin) DEA is not valid. Saying it is inactivate.

## 2017-12-08 NOTE — Telephone Encounter (Signed)
I called [facility] and spoke with Bernerd  He  tried to run script again. It is  still saying DEA inactive.  He called Help desk to make sure the was no computer issue on his end .   He then went on [facility]  DEA website and showed  [place] license was updated and active .   He will follow up with his help desk .

## 2017-12-08 NOTE — Telephone Encounter (Signed)
My pt having problems with DEA controlled Rx   I dont know what to do can you help   Thanks [place]

## 2018-02-08 ENCOUNTER — Encounter: Payer: Self-pay | Admitting: Obstetrics and Gynecology

## 2018-02-08 ENCOUNTER — Ambulatory Visit (INDEPENDENT_AMBULATORY_CARE_PROVIDER_SITE_OTHER): Payer: BLUE CROSS/BLUE SHIELD | Admitting: Obstetrics and Gynecology

## 2018-02-08 VITALS — BP 110/74 | HR 96 | Ht 66.0 in | Wt 163.0 lb

## 2018-02-08 DIAGNOSIS — Z713 Dietary counseling and surveillance: Secondary | ICD-10-CM

## 2018-02-08 DIAGNOSIS — Z124 Encounter for screening for malignant neoplasm of cervix: Secondary | ICD-10-CM

## 2018-02-08 DIAGNOSIS — Z1151 Encounter for screening for human papillomavirus (HPV): Secondary | ICD-10-CM

## 2018-02-08 DIAGNOSIS — Z30015 Encounter for initial prescription of vaginal ring hormonal contraceptive: Secondary | ICD-10-CM

## 2018-02-08 DIAGNOSIS — Z01419 Encounter for gynecological examination (general) (routine) without abnormal findings: Secondary | ICD-10-CM | POA: Diagnosis not present

## 2018-02-08 MED ORDER — ETONOGESTREL-ETHINYL ESTRADIOL 0.12-0.015 MG/24HR VA RING: VAGINAL_RING | VAGINAL | 3 refills | Status: DC

## 2018-02-08 MED ORDER — PHENTERMINE HCL 37.5 MG PO TABS: 37.5000 mg | ORAL_TABLET | Freq: Every day | ORAL | 1 refills | Status: DC

## 2018-02-08 NOTE — Patient Instructions (Signed)
I value your feedback and entrusting us with your care. If you get a [facility] patient survey, I would appreciate you taking the time to let us know about your experience today. Thank you! 

## 2018-02-08 NOTE — Progress Notes (Signed)
PCP:  McLean-Scocuzza, Nino Glow, MD   Chief Complaint  Patient presents with  . Gynecologic Exam    Discuss new birthcontrol options     HPI:      Ms. Lynn Bean is a 32 y.o. G0P0000 who LMP was Patient's last menstrual period was 02/02/2018 (exact date)., presents today for her annual examination.  Her menses are monthly with OCPs but irreg due to generic pills, lasting 3 days.  Dysmenorrhea none. She does not have intermenstrual bleeding. Pharmacy changes brand every month and pt frustrated. Wants to restart nuvaring. Did well in past but had to change due to ins.   Sex activity: single partner, contraception - OCP (estrogen/progesterone).  Last Pap: February 04, 2016  Results were: no abnormalities . HPV DNA not done due to insurance coverage. Hx of STDs: none  There is no FH of breast cancer. There is no FH of ovarian cancer. The patient does not do self-breast exams.  Tobacco use: The patient denies current or previous tobacco use. Alcohol use: none No drug use.  Exercise: moderately active  She does get adequate calcium and Vitamin D in her diet.  Pt wants to lose wt. Did phentermine with Dr. Ouida Sills but has gained about 12# back since stopping it. Exercises regularly. Has not done diet change. Wants to restart it.    Past Medical History:  Diagnosis Date  . Acne   . ADD (attention deficit disorder)   . Depression   . 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 Ruptured disc, thoracic 2007    History reviewed. No pertinent surgical history.  Family History  Problem Relation Age of Onset  . Hypertension Father   . Prostate cancer Paternal Grandfather   . Lymphoma Paternal Grandfather     Social History   Socioeconomic History  . Marital status: Single    Spouse name: Not on file  . Number of children: Not on file  . Years of education: Not on file  . Highest education level: Not on file    Occupational History  . Not on file  Social Needs  . Financial resource strain: Not on file  . Food insecurity:    Worry: Not on file    Inability: Not on file  . Transportation needs:    Medical: Not on file    Non-medical: Not on file  Tobacco Use  . Smoking status: Never Smoker  . Smokeless tobacco: Never Used  Substance and Sexual Activity  . Alcohol use: Yes  . Drug use: No  . Sexual activity: Yes  Lifestyle  . Physical activity:    Days per week: Not on file    Minutes per session: Not on file  . Stress: Not on file  Relationships  . Social connections:    Talks on phone: Not on file    Gets together: Not on file    Attends religious service: Not on file    Active member of club or organization: Not on file    Attends meetings of clubs or organizations: Not on file    Relationship status: Not on file  . Intimate partner violence:    Fear of current or ex partner: Not on file    Emotionally abused: Not on file    Physically abused: Not on file    Forced sexual activity: Not on file  Other Topics Concern  . Not on file  Social History Narrative  Single.   No children.   Works for [employer].   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   Graduated from [employer].   Enjoys playing golf, exercising.     Outpatient Medications Prior to Visit  Medication Sig Dispense Refill  . amphetamine-dextroamphetamine (ADDERALL) 10 MG tablet Take 1 tablet (10 mg total) by mouth 2 (two) times daily. 60 tablet 0  . levonorgestrel-ethinyl estradiol (FALMINA) 0.1-20 MG-MCG tablet Take 1 tablet by mouth daily. 1 Package 0  . amphetamine-dextroamphetamine (ADDERALL) 10 MG tablet Take 1 tablet (10 mg total) by mouth 2 (two) times daily. 60 tablet 0  . amphetamine-dextroamphetamine (ADDERALL) 10 MG tablet Take 1 tablet (10 mg total) by mouth 2 (two) times daily. 60 tablet 0   No facility-administered medications prior to visit.       ROS:  Review of Systems   Constitutional: Negative for fatigue, fever and unexpected weight change.  Respiratory: Negative for cough, shortness of breath and wheezing.   Cardiovascular: Negative for chest pain, palpitations and leg swelling.  Gastrointestinal: Negative for blood in stool, constipation, diarrhea, nausea and vomiting.  Endocrine: Negative for cold intolerance, heat intolerance and polyuria.  Genitourinary: Positive for menstrual problem. Negative for dyspareunia, dysuria, flank pain, frequency, genital sores, hematuria, pelvic pain, urgency, vaginal bleeding, vaginal discharge and vaginal pain.  Musculoskeletal: Negative for back pain, joint swelling and myalgias.  Skin: Negative for rash.  Neurological: Negative for dizziness, syncope, light-headedness, numbness and headaches.  Hematological: Negative for adenopathy.  Psychiatric/Behavioral: Negative for agitation, confusion, sleep disturbance and suicidal ideas. The patient is not nervous/anxious.    BREAST: No symptoms   Objective: BP 110/74 (BP Location: Left Arm, Patient Position: Sitting, Cuff Size: Normal)   Pulse 96   Ht 5\' 6"  (1.676 m)   Wt 163 lb (73.9 kg)   LMP 02/02/2018 (Exact Date)   BMI 26.31 kg/m    Physical Exam  Constitutional: She is oriented to person, place, and time. She appears well-developed and well-nourished.  Genitourinary: Vagina normal and uterus normal. There is no rash or tenderness on the right labia. There is no rash or tenderness on the left labia. No erythema or tenderness in the vagina. No vaginal discharge found. Right adnexum does not display mass and does not display tenderness. Left adnexum does not display mass and does not display tenderness. Cervix does not exhibit motion tenderness or polyp. Uterus is not enlarged or tender.  Neck: Normal range of motion. No thyromegaly present.  Cardiovascular: Normal rate, regular rhythm and normal heart sounds.  No murmur heard. Pulmonary/Chest: Effort normal and  breath sounds normal. Right breast exhibits no mass, no nipple discharge, no skin change and no tenderness. Left breast exhibits no mass, no nipple discharge, no skin change and no tenderness.  Abdominal: Soft. There is no tenderness. There is no guarding.  Musculoskeletal: Normal range of motion.  Neurological: She is alert and oriented to person, place, and time. No cranial nerve deficit.  Psychiatric: She has a normal mood and affect. Her behavior is normal.  Vitals reviewed.   Assessment/Plan: Encounter for annual routine gynecological examination  Cervical cancer screening - Plan: IGP, Aptima HPV  Screening for HPV (human papillomavirus) - Plan: IGP, Aptima HPV  Encounter for initial prescription of vaginal ring hormonal contraceptive - Stop OCPs. Change to nuvaring. Condoms for 1 wk. F/u prn. - Plan: etonogestrel-ethinyl estradiol (NUVARING) 0.12-0.015 MG/24HR vaginal ring  Weight loss counseling, encounter for - Discussed diet/exercise changes. MyFitness Pal/wt watchers. Can  do phentermine for 2 months. Rx faxed. F/u prn.  - Plan: phentermine (ADIPEX-P) 37.5 MG tablet  Meds ordered this encounter  Medications  . etonogestrel-ethinyl estradiol (NUVARING) 0.12-0.015 MG/24HR vaginal ring    Sig: Insert vaginally and leave in place for 3 consecutive weeks, then remove for 1 week.    Dispense:  3 each    Refill:  3    Order Specific Question:   Supervising Provider    Answer:   Gae Dry [id number]  . phentermine (ADIPEX-P) 37.5 MG tablet    Sig: Take 1 tablet (37.5 mg total) by mouth daily before breakfast.    Dispense:  30 tablet    Refill:  1    Order Specific Question:   Supervising Provider    Answer:   Gae Dry [[id number]]             GYN counsel use and side effects of OCP's, adequate intake of calcium and vitamin D, diet and exercise     F/U  Return in about 1 year (around 02/09/2019).  Alicia B. Copland, PA-C 02/08/2018 4:07 PM

## 2018-02-11 ENCOUNTER — Encounter: Payer: Self-pay | Admitting: Obstetrics and Gynecology

## 2018-02-11 LAB — IGP, APTIMA HPV
HPV Aptima: NEGATIVE
PAP Smear Comment: 0

## 2018-02-12 ENCOUNTER — Other Ambulatory Visit: Payer: Self-pay | Admitting: Obstetrics and Gynecology

## 2018-03-01 ENCOUNTER — Encounter: Payer: Self-pay | Admitting: Internal Medicine

## 2018-03-01 ENCOUNTER — Ambulatory Visit (INDEPENDENT_AMBULATORY_CARE_PROVIDER_SITE_OTHER): Payer: BLUE CROSS/BLUE SHIELD | Admitting: Internal Medicine

## 2018-03-01 VITALS — BP 108/66 | HR 107 | Temp 98.5°F | Ht 65.0 in | Wt 159.4 lb

## 2018-03-01 DIAGNOSIS — F909 Attention-deficit hyperactivity disorder, unspecified type: Secondary | ICD-10-CM | POA: Diagnosis not present

## 2018-03-01 MED ORDER — AMPHETAMINE-DEXTROAMPHETAMINE 20 MG PO TABS: 20.0000 mg | ORAL_TABLET | Freq: Two times a day (BID) | ORAL | 0 refills | Status: DC

## 2018-03-01 NOTE — Progress Notes (Signed)
Pre visit review using our clinic review tool, if applicable. No additional management support is needed unless otherwise documented below in the visit note. 

## 2018-03-01 NOTE — Progress Notes (Signed)
Chief Complaint  Patient presents with  . Follow-up   F/u ADHD needs Rx refill She feels like the Rx is wearing off on Adderall 10 mg bid she is taking at 8:30 am and 11 am but used to be able to wait to 2nd dose until 1 pm No new complaints sisters pregnancy is going better she had to go to [facility]  She reports wt gain 12 lbs Lynn Bean Rx adipex x 2 months   Review of Systems  Constitutional: Positive for weight loss.  HENT: Negative for hearing loss.   Eyes: Negative for blurred vision.  Respiratory: Negative for shortness of breath.   Cardiovascular: Negative for chest pain and palpitations.  Skin: Negative for rash.  Psychiatric/Behavioral:       +concentration reduced    Past Medical History:  Diagnosis Date  . Acne   . ADD (attention deficit disorder)   . Depression   . 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 Ruptured disc, thoracic 2007   History reviewed. No pertinent surgical history. Family History  Problem Relation Age of Onset  . Hypertension Father   . Prostate cancer Paternal Grandfather   . Lymphoma Paternal Grandfather    Social History   Socioeconomic History  . Marital status: Single    Spouse name: Not on file  . Number of children: Not on file  . Years of education: Not on file  . Highest education level: Not on file  Occupational History  . Not on file  Social Needs  . Financial resource strain: Not on file  . Food insecurity:    Worry: Not on file    Inability: Not on file  . Transportation needs:    Medical: Not on file    Non-medical: Not on file  Tobacco Use  . Smoking status: Never Smoker  . Smokeless tobacco: Never Used  Substance and Sexual Activity  . Alcohol use: Yes  . Drug use: No  . Sexual activity: Yes  Lifestyle  . Physical activity:    Days per week: Not on file    Minutes per session: Not on file  . Stress: Not on file  Relationships  . Social  connections:    Talks on phone: Not on file    Gets together: Not on file    Attends religious service: Not on file    Active member of club or organization: Not on file    Attends meetings of clubs or organizations: Not on file    Relationship status: Not on file  . Intimate partner violence:    Fear of current or ex partner: Not on file    Emotionally abused: Not on file    Physically abused: Not on file    Forced sexual activity: Not on file  Other Topics Concern  . Not on file  Social History Narrative   Single.   No children.   Works for [employer].   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   Graduated from [employer].   Enjoys playing golf, exercising.    Current Meds  Medication Sig  . etonogestrel-ethinyl estradiol (NUVARING) 0.12-0.015 MG/24HR vaginal ring Insert vaginally and leave in place for 3 consecutive weeks, then remove for 1 week.  . phentermine (ADIPEX-P) 37.5 MG tablet Take 1 tablet (37.5 mg total) by mouth daily before breakfast.   No Known Allergies Recent Results (from the past 2160 hour(s))  IGP, Aptima  HPV     Status: None   Collection Time: 02/08/18  3:24 PM  Result Value Ref Range   DIAGNOSIS: Comment     Comment: NEGATIVE FOR INTRAEPITHELIAL LESION OR MALIGNANCY. FUNGAL ORGANISMS MORPHOLOGICALLY CONSISTENT WITH CANDIDA SPECIES ARE PRESENT.    Specimen adequacy: Comment     Comment: Satisfactory for evaluation. No endocervical component is identified.   Clinician Provided ICD10 Comment     Comment: Z12.4 Z11.51    Performed by: Comment     Comment: Marlane Hatcher, Cytotechnologist (ASCP)   PAP Smear Comment .    Note: Comment     Comment: The Pap smear is a screening test designed to aid in the detection of premalignant and malignant conditions of the uterine cervix.  It is not a diagnostic procedure and should not be used as the sole means of detecting cervical cancer.  Both false-positive and false-negative reports do occur.    Test  Methodology Comment     Comment: This liquid based ThinPrep(R) pap test was screened with the use of an image guided system.    HPV Aptima Negative Negative    Comment: This test detects fourteen high-risk HPV types (16/18/31/33/35/39/45/ 51/52/56/58/59/66/68) without differentiation.    Objective  Body mass index is 26.53 kg/m. Wt Readings from Last 3 Encounters:  03/01/18 159 lb 6.4 oz (72.3 kg)  02/08/18 163 lb (73.9 kg)  12/02/17 158 lb 9.6 oz (71.9 kg)   Temp Readings from Last 3 Encounters:  03/01/18 98.5 F (36.9 C) (Oral)  12/02/17 98.7 F (37.1 C) (Oral)  10/28/16 98.3 F (36.8 C) (Oral)   BP Readings from Last 3 Encounters:  03/01/18 108/66  02/08/18 110/74  12/02/17 120/78   Pulse Readings from Last 3 Encounters:  03/01/18 (!) 107  02/08/18 96  12/02/17 82    Physical Exam  Constitutional: She is oriented to person, place, and time. Vital signs are normal. She appears well-developed and well-nourished. She is cooperative.  HENT:  Head: Normocephalic and atraumatic.  Mouth/Throat: Oropharynx is clear and moist and mucous membranes are normal.  Eyes: Pupils are equal, round, and reactive to light. Conjunctivae are normal.  Cardiovascular: Normal rate, regular rhythm and normal heart sounds.  Pulmonary/Chest: Effort normal and breath sounds normal.  Neurological: She is alert and oriented to person, place, and time. Gait normal.  Skin: Skin is warm, dry and intact.  Angiomas trunk   Psychiatric: She has a normal mood and affect. Her speech is normal and behavior is normal. Judgment and thought content normal. Cognition and memory are normal.  Nursing note and vitals reviewed.   Assessment   1. ADHD 2. HM Plan  1.  Increase adderrall 10 mg bid to 20 mg bid if this does not work refer to psych this may be good idea in future due to pt not wanting any health maintenance but only wanting adderrall  Consider UDS at f/u  2.  Declines vaccines  Pt declines  all labs for health maintenance  On Nuva ring for contraception, stopped OCP Pap 02/08/18 neg pap neg HPV Using sun screen SPR 70 Provider: Dr. Olivia Mackie McLean-Scocuzza-Internal Medicine

## 2018-03-01 NOTE — Patient Instructions (Signed)
Take care  Please schedule early 06/2018

## 2018-04-20 ENCOUNTER — Encounter: Payer: Self-pay | Admitting: Obstetrics and Gynecology

## 2018-06-02 ENCOUNTER — Encounter: Payer: Self-pay | Admitting: Internal Medicine

## 2018-06-02 ENCOUNTER — Other Ambulatory Visit: Payer: Self-pay | Admitting: Internal Medicine

## 2018-06-02 DIAGNOSIS — F909 Attention-deficit hyperactivity disorder, unspecified type: Secondary | ICD-10-CM

## 2018-06-02 MED ORDER — AMPHETAMINE-DEXTROAMPHETAMINE 20 MG PO TABS: 20.0000 mg | ORAL_TABLET | Freq: Two times a day (BID) | ORAL | 0 refills | Status: DC

## 2018-06-07 ENCOUNTER — Ambulatory Visit: Payer: Self-pay | Admitting: Internal Medicine

## 2018-06-07 DIAGNOSIS — Z0289 Encounter for other administrative examinations: Secondary | ICD-10-CM

## 2018-06-28 ENCOUNTER — Telehealth: Payer: Self-pay

## 2018-06-28 NOTE — Telephone Encounter (Signed)
Copied from [place]. Topic: Appointment Scheduling - Scheduling Inquiry for Clinic >> Jun 28, 2018 11:39 AM Conception Chancy, NT wrote: Reason for CRM: patient is calling and states that she is very disappointed that she received a $50 no show/late cancelation fee from visit that was supposed to be on 06/07/18. It is documented that the patient canceled at 10:26am on 06/07/18 due to a event. Patient states she called on 06/06/18 not the 6th. Patient also states that she let Dr. Terese Door know a week in advance that she no longer wanted to see her and wanted a new doctor. Patient states she will not give [facility] any more of her money and will not have anything else to do with this office. Patient is wanting this fee waived as she states there is no reason she received it.  Please advise and contact the patient.

## 2018-06-29 NOTE — Telephone Encounter (Signed)
Her information has been sent to charge correction for the removal of the no show fee. Dr. Terese Door has been removed as her PCP per her request.

## 2018-07-12 ENCOUNTER — Telehealth: Payer: Self-pay | Admitting: Internal Medicine

## 2018-07-12 ENCOUNTER — Encounter: Payer: Self-pay | Admitting: Internal Medicine

## 2018-07-12 NOTE — Telephone Encounter (Unsigned)
Copied from [place] [phone]. Topic: Quick Communication - See Telephone Encounter >> Jul 12, 2018  5:29 PM Neva Seat wrote: Pt is asking if the letter written by Dr. Karlyn Agee - Scocuzza be removed from her medical records. Pt was very displeased with Dr. Olivia Mackie.

## 2018-07-13 NOTE — Telephone Encounter (Signed)
Please advise 

## 2018-07-13 NOTE — Telephone Encounter (Signed)
See phone call as well

## 2018-07-16 ENCOUNTER — Encounter: Payer: Self-pay | Admitting: Internal Medicine

## 2018-09-08 ENCOUNTER — Ambulatory Visit (INDEPENDENT_AMBULATORY_CARE_PROVIDER_SITE_OTHER): Payer: BLUE CROSS/BLUE SHIELD | Admitting: Podiatry

## 2018-09-08 ENCOUNTER — Ambulatory Visit (INDEPENDENT_AMBULATORY_CARE_PROVIDER_SITE_OTHER): Payer: BLUE CROSS/BLUE SHIELD

## 2018-09-08 ENCOUNTER — Encounter: Payer: Self-pay | Admitting: Podiatry

## 2018-09-08 DIAGNOSIS — M722 Plantar fascial fibromatosis: Secondary | ICD-10-CM

## 2018-09-08 MED ORDER — METHYLPREDNISOLONE 4 MG PO TBPK: ORAL_TABLET | ORAL | 0 refills | Status: DC

## 2018-09-08 MED ORDER — MELOXICAM 15 MG PO TABS: 15.0000 mg | ORAL_TABLET | Freq: Every day | ORAL | 3 refills | Status: DC

## 2018-09-08 NOTE — Patient Instructions (Signed)

## 2018-09-08 NOTE — Progress Notes (Signed)
  Subjective:  Patient ID: Lynn Bean, female    DOB: [birth year],  MRN: [id number] HPI Chief Complaint  Patient presents with  . Foot Pain    Patient presents today for left heel pain x 2 months  She states "I have sharp stabbing pains that are constant"  She reports the pain is worse in the mornings and after sitting a while then stand.   She has been using old orthotics and ice for treatment    32 y.o. female presents with the above complaint.   ROS: Denies fever chills nausea vomiting muscle aches pains calf pain back pain chest pain shortness of breath.  Past Medical History:  Diagnosis Date  . Acne   . ADD (attention deficit disorder)   . Depression   . 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 Ruptured disc, thoracic 2007   No past surgical history on file.  Current Outpatient Medications:  .  etonogestrel-ethinyl estradiol (NUVARING) 0.12-0.015 MG/24HR vaginal ring, Insert vaginally and leave in place for 3 consecutive weeks, then remove for 1 week., Disp: 3 each, Rfl: 3 .  meloxicam (MOBIC) 15 MG tablet, Take 1 tablet (15 mg total) by mouth daily., Disp: 30 tablet, Rfl: 3 .  methylPREDNISolone (MEDROL DOSEPAK) 4 MG TBPK tablet, 6 day dose pack - take as directed, Disp: 21 tablet, Rfl: 0  No Known Allergies Review of Systems Objective:  There were no vitals filed for this visit.  General: Well developed, nourished, in no acute distress, alert and oriented x3   Dermatological: Skin is warm, dry and supple bilateral. Nails x 10 are well maintained; remaining integument appears unremarkable at this time. There are no open sores, no preulcerative lesions, no rash or signs of infection present.  Vascular: Dorsalis Pedis artery and Posterior Tibial artery pedal pulses are 2/4 bilateral with immedate capillary fill time. Pedal hair growth present. No varicosities and no lower extremity edema present bilateral.    Neruologic: Grossly intact via light touch bilateral. Vibratory intact via tuning fork bilateral. Protective threshold with Semmes Wienstein monofilament intact to all pedal sites bilateral. Patellar and Achilles deep tendon reflexes 2+ bilateral. No Babinski or clonus noted bilateral.   Musculoskeletal: No gross boney pedal deformities bilateral. No pain, crepitus, or limitation noted with foot and ankle range of motion bilateral. Muscular strength 5/5 in all groups tested bilateral.  Severe pain on palpation medial calcaneal tubercle of the left heel.  No pain on palpation medially and laterally of the calcaneus.  Gait: Unassisted, Nonantalgic.    Radiographs:  Radiographs taken today of the left foot demonstrate an osseously mature individual no acute findings.  Soft tissue increase in density plantar fashion calcaneal insertion site plantar distally oriented calcaneal heel spur.  Assessment & Plan:   Assessment: Plantar fasciitis left  Plan: Discussed etiology pathology conservative versus surgical therapies.  At this point started her on a Medrol Dosepak to be followed by meloxicam.  Also injected 20 mg Kenalog 5 mg Marcaine point maximal tenderness of the left heel.  Tolerated procedure well without complications.  Placed her in a plantar fascial brace to be followed by a night splint.  Structure to continue to use the simple orthotics that she has currently in good shoes.  I will follow-up with her in 1 month should she have questions or concerns she will notify us immediately.     Max T. Stockham, [place]

## 2018-10-12 ENCOUNTER — Ambulatory Visit: Payer: BLUE CROSS/BLUE SHIELD | Admitting: Podiatry

## 2018-11-16 ENCOUNTER — Encounter: Payer: Self-pay | Admitting: Podiatry

## 2018-11-16 ENCOUNTER — Ambulatory Visit (INDEPENDENT_AMBULATORY_CARE_PROVIDER_SITE_OTHER): Payer: BLUE CROSS/BLUE SHIELD | Admitting: Podiatry

## 2018-11-16 DIAGNOSIS — M722 Plantar fascial fibromatosis: Secondary | ICD-10-CM

## 2018-11-16 MED ORDER — METHYLPREDNISOLONE 4 MG PO TBPK: ORAL_TABLET | ORAL | 0 refills | Status: DC

## 2018-11-16 NOTE — Progress Notes (Signed)
She presents today for follow-up of her plantar fasciitis.  States that is better but is just not well yet has a little bit of hard skin around it but all in all she is doing better.  Objective: She has reproducible pain on palpation medial calcaneal tubercle of her left heel.  There is not nearly as tender as it was in the past.  Assessment: Resolving plantar fasciitis left.  Plan: Wrote another prescription for Medrol Dosepak follow-up with her in 1 month if necessary.

## 2018-12-12 DIAGNOSIS — N912 Amenorrhea, unspecified: Secondary | ICD-10-CM | POA: Diagnosis not present

## 2019-01-04 ENCOUNTER — Other Ambulatory Visit: Payer: Self-pay | Admitting: Obstetrics and Gynecology

## 2019-01-04 DIAGNOSIS — Z30015 Encounter for initial prescription of vaginal ring hormonal contraceptive: Secondary | ICD-10-CM

## 2019-01-08 ENCOUNTER — Other Ambulatory Visit: Payer: Self-pay | Admitting: Obstetrics and Gynecology

## 2019-01-08 DIAGNOSIS — Z30015 Encounter for initial prescription of vaginal ring hormonal contraceptive: Secondary | ICD-10-CM

## 2019-01-09 DIAGNOSIS — Z3401 Encounter for supervision of normal first pregnancy, first trimester: Secondary | ICD-10-CM | POA: Diagnosis not present

## 2019-01-09 LAB — OB RESULTS CONSOLE HEPATITIS B SURFACE ANTIGEN: Hepatitis B Surface Ag: NEGATIVE

## 2019-01-09 LAB — OB RESULTS CONSOLE GC/CHLAMYDIA
Chlamydia: NEGATIVE
Gonorrhea: NEGATIVE

## 2019-01-09 LAB — OB RESULTS CONSOLE ABO/RH: RH Type: POSITIVE

## 2019-01-09 LAB — OB RESULTS CONSOLE HIV ANTIBODY (ROUTINE TESTING): HIV: NONREACTIVE

## 2019-01-09 LAB — OB RESULTS CONSOLE VARICELLA ZOSTER ANTIBODY, IGG: Varicella: IMMUNE

## 2019-01-09 LAB — OB RESULTS CONSOLE ANTIBODY SCREEN: Antibody Screen: NEGATIVE

## 2019-01-09 LAB — OB RESULTS CONSOLE RUBELLA ANTIBODY, IGM: Rubella: IMMUNE

## 2019-03-06 DIAGNOSIS — Z3402 Encounter for supervision of normal first pregnancy, second trimester: Secondary | ICD-10-CM | POA: Diagnosis not present

## 2019-03-08 DIAGNOSIS — O285 Abnormal chromosomal and genetic finding on antenatal screening of mother: Secondary | ICD-10-CM | POA: Diagnosis not present

## 2019-03-08 DIAGNOSIS — Z8279 Family history of other congenital malformations, deformations and chromosomal abnormalities: Secondary | ICD-10-CM | POA: Diagnosis not present

## 2019-03-08 DIAGNOSIS — O0993 Supervision of high risk pregnancy, unspecified, third trimester: Secondary | ICD-10-CM | POA: Diagnosis not present

## 2019-03-08 DIAGNOSIS — O289 Unspecified abnormal findings on antenatal screening of mother: Secondary | ICD-10-CM | POA: Diagnosis not present

## 2019-03-09 ENCOUNTER — Other Ambulatory Visit: Payer: Self-pay | Admitting: Obstetrics and Gynecology

## 2019-03-09 DIAGNOSIS — O289 Unspecified abnormal findings on antenatal screening of mother: Secondary | ICD-10-CM

## 2019-03-20 ENCOUNTER — Other Ambulatory Visit: Payer: Self-pay

## 2019-03-20 ENCOUNTER — Ambulatory Visit
Admission: RE | Admit: 2019-03-20 | Discharge: 2019-03-20 | Disposition: A | Discharge status: Home / Self Care | Payer: BLUE CROSS/BLUE SHIELD | Source: Ambulatory Visit | Attending: Maternal & Fetal Medicine | Admitting: Maternal & Fetal Medicine

## 2019-03-20 DIAGNOSIS — O280 Abnormal hematological finding on antenatal screening of mother: Secondary | ICD-10-CM

## 2019-03-20 DIAGNOSIS — Z3A22 22 weeks gestation of pregnancy: Secondary | ICD-10-CM

## 2019-03-20 DIAGNOSIS — Z8279 Family history of other congenital malformations, deformations and chromosomal abnormalities: Secondary | ICD-10-CM | POA: Diagnosis not present

## 2019-03-20 NOTE — Progress Notes (Signed)
Virtual Visit via Telephone Note  I connected with Dietrich Pates on 03/20/2019 at 12:00 PM EDT by telephone and verified that I am speaking with the correct person using two identifiers.   Referring Provider:   [facility] Length of Consultation: 40 minutes   Ms. Durkin was referred to [facility] for genetic counseling and targeted ultrasound because of an increased risk for Down syndrome by the maternal serum prenatal screen.  This note summarizes the information we discussed.   We provided background information on the maternal serum prenatal screen.  It was explained that this is a maternal blood test performed between the 14th and 21st week of pregnancy which measures several pregnancy proteins.  The levels of these proteins can help determine if a pregnancy is at high risk for certain birth defects or problems.  However, it cannot diagnose or rule out these defects.  An abnormal maternal serum screen does not necessarily mean that the baby has a problem.  Maternal serum screening can identify approximately 80% of neural tube defects, up to 75% of Down syndrome cases and 60% of trisomy 18 cases.  The neural tube consists of the fetal head and spine and if this structure fails to close during development, spina bifida (open spine) or anencephaly (open skull) could result.  Chromosomes are the inherited structures that contain our instructions for development (genes).  Each cell in our body normally has 46 chromosomes.  Rarely, when an egg and sperm unite, an extra or missing chromosome can be passed on to the baby by mistake.  These types of chromosome problems typically cause mental retardation and might also cause birth defects.  We discussed Down syndrome (an extra chromosome #21) and trisomy 26 (an extra chromosome #18).    The maternal serum screen revealed protein levels that increased the chance of Down syndrome (Trisomy 21) in the pregnancy.   Before maternal serum screening, the age-related chance of Down syndrome in the pregnancy was approximately 1 in 360.  Given the maternal serum screen results, the chance is now estimated to be 1 in 101.  This means that the chance the baby does not have Down sydnrome is greater than 99 percent.  The following prenatal testing options were discussed for this pregnancy:  Targeted ultrasound uses high frequency sound waves to create an image of the developing fetus.  An ultrasound is often recommended as a routine means of evaluating the pregnancy.  It is also used to screen for fetal anatomy problems (for example, a heart defect) that might be suggestive of a chromosomal or other abnormality.    Amniocentesis involves the removal of a small amount of amniotic fluid from the sac surrounding the fetus with the use of a thin needle inserted through the maternal abdomen and uterus.  Ultrasound guidance is used throughout the procedure.  Fetal cells are directly evaluated and > 98% of neural tube defects can be detected.  The main risks to this procedure include complications leading to miscarriage in less than 1 in 200 cases (0.5%).    We also reviewed the results of cell free fetal DNA testing from maternal blood to determine whether or not the baby may have Down syndrome, trisomy 60, or trisomy 23.  This test utilizes a maternal blood sample and DNA sequencing technology to isolate circulating cell free fetal DNA from maternal plasma.  The fetal DNA can then be analyzed for DNA sequences that are derived from the three most common  chromosomes involved in aneuploidy, chromosomes 13, 18, and 21.  If the overall amount of DNA is greater than the expected level for any of these chromosomes, aneuploidy is suspected.  While we do not consider it a replacement for invasive testing and karyotype analysis, a negative result from this testing would be reassuring, though not a guarantee of a normal chromosome complement  for the baby.  An abnormal result is certainly suggestive of an abnormal chromosome complement, though we would still recommend CVS or amniocentesis to confirm any findings from this testing. This testing was previously ordered at [facility] and the results showed DNA consistent with two copies of chromosomes 21, 18 and 13.  The sensitivity for trisomy 54, trisomy 90 and trisomy 79 using this testing are reported as 99.1%, 99.9% and 91.7% respectively.  While the results of this testing are highly accurate, they are not considered diagnostic at this time.  Should more definitive information be desired, the patient was still offered amniocentesis.   Cystic Fibrosis and Spinal Muscular Atrophy (SMA) screening were also discussed with the patient. Both conditions are recessive, which means that both parents must be carriers in order to have a child with the disease.  Cystic fibrosis (CF) is one of the most common genetic conditions in persons of Caucasian ancestry.  This condition occurs in approximately 1 in 2,500 Caucasian persons and results in thickened secretions in the lungs, digestive, and reproductive systems.  For a baby to be at risk for having CF, both of the parents must be carriers for this condition.  Approximately 1 in 71 Caucasian persons is a carrier for CF.  Current carrier testing looks for the most common mutations in the gene for CF and can detect approximately 90% of carriers in the Caucasian population.  This means that the carrier screening can greatly reduce, but cannot eliminate, the chance for an individual to have a child with CF.  If an individual is found to be a carrier for CF, then carrier testing would be available for the partner. As part of [place] newborn screening profile, all babies born in the state of [place] will have a two-tier screening process.  Specimens are first tested to determine the concentration of immunoreactive trypsinogen (IRT).  The top 5% of  specimens with the highest IRT values then undergo DNA testing using a panel of over 40 common CF mutations. SMA is a neurodegenerative disorder that leads to atrophy of skeletal muscle and overall weakness.  This condition is also more prevalent in the Caucasian population, with 1 in 40-1 in 60 persons being a carrier and 1 in 6,000-1 in 10,000 children being affected.  There are multiple forms of the disease, with some causing death in infancy to other forms with survival into adulthood.  The genetics of SMA is complex, but carrier screening can detect up to 95% of carriers in the Caucasian population.  Similar to CF, a negative result can greatly reduce, but cannot eliminate, the chance to have a child with SMA. The patient and her husband are of Caucasian background.  We also reviewed the option of carrier screening for hemoglobinopathies.  We obtained a detailed family history and pregnancy history.  Ms. Trunnell stated that this is the first pregnancy for she and her husband.  She reported no complications or exposures in the pregnancy that would be expected to increase the risk for birth defects.  She is taking prenatal vitamins and supplemental folic acid prescribed by her OB  due to the family history of spina bifida.    In the family history, the patient reported that her sister has a daughter with an open neural tube defect, or spina bifida.  She is 34 year old and developing normally except for delays in movement of her legs.  Her sister elected for in utero surgery for repair of the condition during her pregnancy.  The child has no other health concerns or underlying genetic conditions (testing for chromosome conditions are normal prior to fetal surgery per patient).  We reviewed the multifactorial inheritance of neural tube defects in the absence of a known genetic syndrome.  We would expect approximately a 1% chance for an open neural tube defect in this pregnancy based upon this history.  Of note,  the father of the baby also reported a relative with spina bifida.  This is a second cousin to the fetus (a 5th degree relative) and would likely not increase the risk significantly above the 1% already estimated. The maternal serum screening results showed at 1 in 10,000 chance for an open neural tube defect given the normal AFP value. The remainder of the family history is unremarkable for birth defects, developmental delays, recurrent pregnancy loss or known chromosome abnormalities.  After consideration of the options, Ms. Marzano elected to proceed with an ultrasound only and [facility] on Thursday, 03/23/19.  Given the elevated hCG level, we would also recommend an ultrasound for fetal growth in the third trimester, as elevated hCG has been associated with placental concerns, growth restriction and poor pregnancy outcomes.  The patient was encouraged to call with questions or concerns. We can be contacted at [phone].   I provided 40 minutes of non-face-to-face time during this encounter.   Donette Larry

## 2019-03-23 ENCOUNTER — Ambulatory Visit: Payer: BLUE CROSS/BLUE SHIELD

## 2019-03-23 ENCOUNTER — Ambulatory Visit
Admission: RE | Admit: 2019-03-23 | Discharge: 2019-03-23 | Disposition: A | Discharge status: Home / Self Care | Payer: BLUE CROSS/BLUE SHIELD | Source: Ambulatory Visit | Attending: Obstetrics & Gynecology | Admitting: Obstetrics & Gynecology

## 2019-03-23 ENCOUNTER — Other Ambulatory Visit: Payer: Self-pay

## 2019-03-23 DIAGNOSIS — O289 Unspecified abnormal findings on antenatal screening of mother: Secondary | ICD-10-CM | POA: Insufficient documentation

## 2019-03-23 DIAGNOSIS — Z3A22 22 weeks gestation of pregnancy: Secondary | ICD-10-CM | POA: Diagnosis not present

## 2019-04-10 NOTE — Progress Notes (Signed)
History reviewed. Agree with history as outlined in [place] note.

## 2019-05-08 DIAGNOSIS — Z23 Encounter for immunization: Secondary | ICD-10-CM | POA: Diagnosis not present

## 2019-05-08 DIAGNOSIS — O26843 Uterine size-date discrepancy, third trimester: Secondary | ICD-10-CM | POA: Diagnosis not present

## 2019-05-12 DIAGNOSIS — O0993 Supervision of high risk pregnancy, unspecified, third trimester: Secondary | ICD-10-CM | POA: Diagnosis not present

## 2019-05-12 DIAGNOSIS — O10919 Unspecified pre-existing hypertension complicating pregnancy, unspecified trimester: Secondary | ICD-10-CM | POA: Diagnosis not present

## 2019-05-12 DIAGNOSIS — Z3402 Encounter for supervision of normal first pregnancy, second trimester: Secondary | ICD-10-CM | POA: Diagnosis not present

## 2019-05-22 DIAGNOSIS — O0993 Supervision of high risk pregnancy, unspecified, third trimester: Secondary | ICD-10-CM | POA: Diagnosis not present

## 2019-06-05 DIAGNOSIS — O3663X0 Maternal care for excessive fetal growth, third trimester, not applicable or unspecified: Secondary | ICD-10-CM | POA: Diagnosis not present

## 2019-06-05 DIAGNOSIS — O0993 Supervision of high risk pregnancy, unspecified, third trimester: Secondary | ICD-10-CM | POA: Diagnosis not present

## 2019-06-20 ENCOUNTER — Other Ambulatory Visit: Payer: Self-pay

## 2019-06-20 ENCOUNTER — Observation Stay
Admission: EM | Admit: 2019-06-20 | Discharge: 2019-06-20 | Disposition: A | Discharge status: Home / Self Care | Payer: BC Managed Care – PPO | Attending: Certified Nurse Midwife | Admitting: Certified Nurse Midwife

## 2019-06-20 DIAGNOSIS — Z3A35 35 weeks gestation of pregnancy: Secondary | ICD-10-CM | POA: Diagnosis not present

## 2019-06-20 DIAGNOSIS — O26893 Other specified pregnancy related conditions, third trimester: Secondary | ICD-10-CM | POA: Diagnosis not present

## 2019-06-20 DIAGNOSIS — R030 Elevated blood-pressure reading, without diagnosis of hypertension: Secondary | ICD-10-CM | POA: Diagnosis not present

## 2019-06-20 DIAGNOSIS — F988 Other specified behavioral and emotional disorders with onset usually occurring in childhood and adolescence: Secondary | ICD-10-CM | POA: Diagnosis not present

## 2019-06-20 DIAGNOSIS — F329 Major depressive disorder, single episode, unspecified: Secondary | ICD-10-CM | POA: Diagnosis not present

## 2019-06-20 DIAGNOSIS — O99343 Other mental disorders complicating pregnancy, third trimester: Secondary | ICD-10-CM | POA: Diagnosis not present

## 2019-06-20 DIAGNOSIS — Z8249 Family history of ischemic heart disease and other diseases of the circulatory system: Secondary | ICD-10-CM | POA: Insufficient documentation

## 2019-06-20 DIAGNOSIS — O163 Unspecified maternal hypertension, third trimester: Secondary | ICD-10-CM | POA: Diagnosis present

## 2019-06-20 DIAGNOSIS — O139 Gestational [pregnancy-induced] hypertension without significant proteinuria, unspecified trimester: Secondary | ICD-10-CM | POA: Diagnosis present

## 2019-06-20 DIAGNOSIS — O133 Gestational [pregnancy-induced] hypertension without significant proteinuria, third trimester: Secondary | ICD-10-CM | POA: Diagnosis not present

## 2019-06-20 LAB — COMPREHENSIVE METABOLIC PANEL
ALT: 15 U/L (ref 0–44)
AST: 21 U/L (ref 15–41)
Albumin: 3 g/dL — ABNORMAL LOW (ref 3.5–5.0)
Alkaline Phosphatase: 104 U/L (ref 38–126)
Anion gap: 8 (ref 5–15)
BUN: 9 mg/dL (ref 6–20)
CO2: 21 mmol/L — ABNORMAL LOW (ref 22–32)
Calcium: 9.4 mg/dL (ref 8.9–10.3)
Chloride: 105 mmol/L (ref 98–111)
Creatinine, Ser: 0.88 mg/dL (ref 0.44–1.00)
GFR calc Af Amer: >60 mL/min (ref >60)
GFR calc non Af Amer: >60 mL/min (ref >60)
Glucose, Bld: 94 mg/dL (ref 70–99)
Potassium: 3.9 mmol/L (ref 3.5–5.1)
Sodium: 134 mmol/L — ABNORMAL LOW (ref 135–145)
Total Bilirubin: 0.3 mg/dL (ref 0.3–1.2)
Total Protein: 6 g/dL — ABNORMAL LOW (ref 6.5–8.1)

## 2019-06-20 LAB — PROTEIN / CREATININE RATIO, URINE
Creatinine, Urine: 127 mg/dL
Protein Creatinine Ratio: 0.26 mg/mg{Cre} — ABNORMAL HIGH (ref 0.00–0.15)
Total Protein, Urine: 33 mg/dL

## 2019-06-20 LAB — CBC
HCT: 35.7 % — ABNORMAL LOW (ref 36.0–46.0)
Hemoglobin: 11.6 g/dL — ABNORMAL LOW (ref 12.0–15.0)
MCH: 27.3 pg (ref 26.0–34.0)
MCHC: 32.5 g/dL (ref 30.0–36.0)
MCV: 84 fL (ref 80.0–100.0)
Platelets: 213 10*3/uL (ref 150–400)
RBC: 4.25 MIL/uL (ref 3.87–5.11)
RDW: 18.8 % — ABNORMAL HIGH (ref 11.5–15.5)
WBC: 10.7 10*3/uL — ABNORMAL HIGH (ref 4.0–10.5)
nRBC: 0 % (ref 0.0–0.2)

## 2019-06-20 LAB — SAMPLE TO BLOOD BANK

## 2019-06-20 MED ORDER — ACETAMINOPHEN 500 MG PO TABS: 1000.0000 mg | ORAL_TABLET | Freq: Four times a day (QID) | ORAL | Status: DC | PRN

## 2019-06-20 NOTE — Discharge Summary (Signed)
Lynn Bean is a 33 y.o. female. She is at [redacted]w[redacted]d gestation. Patient's last menstrual period was 10/18/2018. Estimated Date of Delivery: 07/25/2019.  Prenatal care site: [facility]   Chief complaint: elevated BP in the office Location: n/a Onset/timing: earlier today Duration: n/a Quality: n/a Severity: mild to moderate Aggravating or alleviating conditions: n/a Associated signs/symptoms: none Context: Lynn Bean was seen in the office earlier today with an initial blood pressure of 150/100 and a repeat of 158/100. She was sent to L&D triage for serial blood pressure measurement and lab evaluation.   She had elevated blood pressures earlier in pregnancy: 146/90 at [redacted]w[redacted]d, 140/90 at [redacted]w[redacted]d, and 146/88 at [redacted]w[redacted]d. She also reports a history of "white coat hypertension."  S: Resting comfortably.   She reports:  -active fetal movement -no leakage of fluid  -no vaginal bleeding -no contractions -b/l lower extremity edema, worsened in the past week -no headache, no visual changes, no N/V, no RUQ/epigastric pain  Maternal Medical History:   Past Medical History:  Diagnosis Date  . Acne   . ADD (attention deficit disorder)   . Depression   . 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 Ruptured disc, thoracic 2007    Past Surgical History:  Procedure Laterality Date  . NO PAST SURGERIES      Allergies  Allergen Reactions  . Benadryl [Diphenhydramine]     Only brand name benadryl    Prior to Admission medications   Medication Sig Start Date End Date Taking? Authorizing Provider  folic acid (FOLVITE) 1 MG tablet Take 4 mg by mouth daily.    [provider]  Prenatal Vit-Fe Fumarate-FA (PRENATAL MULTIVITAMIN) TABS tablet Take 1 tablet by mouth daily at 12 noon.    [provider]     Social History: She  reports that she has never smoked. She has never used smokeless tobacco. She reports  previous alcohol use. She reports that she does not use drugs.  Family History: family history includes Hypertension in her father; Lymphoma in her paternal grandfather; Prostate cancer in her paternal grandfather.   Review of Systems: A full review of systems was performed and negative except as noted in the HPI.     O:  BP (!) 146/87 (BP Location: Left Arm)   Pulse (!) 101   Temp 98.9 F (37.2 C) (Oral)   Resp 16   Ht 5\' 6"  (1.676 m)   Wt 112.5 kg   LMP 10/18/2018   BMI 40.03 kg/m  Results for orders placed or performed during the hospital encounter of 06/20/19 (from the past 48 hour(s))  Protein / creatinine ratio, urine   Collection Time: 06/20/19  3:55 PM  Result Value Ref Range   Creatinine, Urine 127 mg/dL   Total Protein, Urine 33 mg/dL   Protein Creatinine Ratio 0.26 (H) 0.00 - 0.15 mg/mg[Cre]  CBC on admission   Collection Time: 06/20/19  4:15 PM  Result Value Ref Range   WBC 10.7 (H) 4.0 - 10.5 K/uL   RBC 4.25 3.87 - 5.11 MIL/uL   Hemoglobin 11.6 (L) 12.0 - 15.0 g/dL   HCT 35.7 (L) 36.0 - 46.0 %   MCV 84.0 80.0 - 100.0 fL   MCH 27.3 26.0 - 34.0 pg   MCHC 32.5 30.0 - 36.0 g/dL   RDW 18.8 (H) 11.5 - 15.5 %   Platelets 213 150 - 400 K/uL   nRBC 0.0 0.0 - 0.2 %  Comprehensive metabolic panel   Collection Time: 06/20/19  4:15 PM  Result Value Ref Range   Sodium 134 (L) 135 - 145 mmol/L   Potassium 3.9 3.5 - 5.1 mmol/L   Chloride 105 98 - 111 mmol/L   CO2 21 (L) 22 - 32 mmol/L   Glucose, Bld 94 70 - 99 mg/dL   BUN 9 6 - 20 mg/dL   Creatinine, Ser 0.88 0.44 - 1.00 mg/dL   Calcium 9.4 8.9 - 10.3 mg/dL   Total Protein 6.0 (L) 6.5 - 8.1 g/dL   Albumin 3.0 (L) 3.5 - 5.0 g/dL   AST 21 15 - 41 U/L   ALT 15 0 - 44 U/L   Alkaline Phosphatase 104 38 - 126 U/L   Total Bilirubin 0.3 0.3 - 1.2 mg/dL   GFR calc non Af Amer >60 >60 mL/min   GFR calc Af Amer >60 >60 mL/min   Anion gap 8 5 - 15  Sample to Blood Bank   Collection Time: 06/20/19  4:15 PM  Result Value  Ref Range   Blood Bank Specimen SAMPLE AVAILABLE FOR TESTING    Sample Expiration      06/23/2019,2359 Performed at [facility], [address] [place], [place] [address]      Constitutional: NAD, AAOx3  HE/ENT: extraocular movements grossly intact, moist mucous membranes CV: RRR PULM: normal respiratory effort, CTABL     Abd: gravid, non-tender, non-distended, soft      Ext: Non-tender, Nonedmeatous   Psych: mood appropriate, speech normal Pelvic deferred  NST:  Baseline: 155bpm Variability: moderate Accelerations: 15x15 present x >2 Decelerations: absent Time: 107mins Toco: uterine irritability with occasional contraction   A/P: 33 y.o. [redacted]w[redacted]d here for antenatal surveillance during pregnancy.  Principle diagnosis: gestational hypertension versus chronic hypertension in pregnancy  Labor  Not present  Fetal Wellbeing  Reactive NST, reassuring for GA  Hypertension  Labs with no evidence of preeclampisa  Blood pressures mild range. Reviewed home BP monitoring and when to call the office and present for care.   IOL scheduled for [redacted]w[redacted]d on 07/05/2019 at 0001  D/c home stable, precautions reviewed, follow-up as scheduled.    Lisette Grinder 06/20/2019 5:54 PM  ----- Lisette Grinder, CNM Certified Nurse Midwife [facility], Department of [facility]

## 2019-06-20 NOTE — OB Triage Note (Signed)
Pt. Presented to L/D triage for [facility] workup from office. BP 146/90. 2+/3+ edema in lower extremities that pt reports has worsened in the past week. No other PIH symptoms. Pt reports no bleeding or LOF. Positive fetal movement. VSS. Will continue to monitor.

## 2019-06-22 ENCOUNTER — Other Ambulatory Visit: Payer: Self-pay | Admitting: Obstetrics and Gynecology

## 2019-06-22 NOTE — Progress Notes (Signed)
Lynn Bean is a 33 y.o. G1P0000 female dated by LMP c/w [redacted]w[redacted]d ultrasound.   Pregnancy Issues: 1. Prepregnancy BMI 30 2. Size > dates, last growth on 06/05/2019 with EFW 2445g 71% 3. Sister with spina bifida, AFP negative for OSB 4. Gestational hypertension 5. AFP/Tetra positive for T21, cfDNA negative for T21 6. PUPPs 7. Lower extremity swelling, started on HCTZ 12.5mg  daily on 05/08/2019 8. Elevated 1h OGTT, repeat WNL   Prenatal care site: [facility]   Prenatal Labs: Blood type/Rh B+  Antibody screen neg  Rubella Immune  Varicella Immune  RPR NR  HBsAg Neg  HIV NR  GC neg  Chlamydia neg  Genetic screening negative  1 hour GTT 170, 127  3 hour GTT --  GBS pending   Post Partum Planning: - Infant feeding: breastfeeding - Contraception: NuvaRing  Tdap given 05/08/2019

## 2019-06-24 ENCOUNTER — Inpatient Hospital Stay
Admission: EM | Admit: 2019-06-24 | Discharge: 2019-06-25 | DRG: 833 | Disposition: A | Discharge status: Home / Self Care | Payer: BC Managed Care – PPO | Attending: Obstetrics & Gynecology | Admitting: Obstetrics & Gynecology

## 2019-06-24 ENCOUNTER — Other Ambulatory Visit: Payer: Self-pay

## 2019-06-24 DIAGNOSIS — O139 Gestational [pregnancy-induced] hypertension without significant proteinuria, unspecified trimester: Secondary | ICD-10-CM | POA: Diagnosis present

## 2019-06-24 DIAGNOSIS — O1403 Mild to moderate pre-eclampsia, third trimester: Principal | ICD-10-CM | POA: Diagnosis present

## 2019-06-24 DIAGNOSIS — Z20828 Contact with and (suspected) exposure to other viral communicable diseases: Secondary | ICD-10-CM | POA: Diagnosis not present

## 2019-06-24 DIAGNOSIS — R030 Elevated blood-pressure reading, without diagnosis of hypertension: Secondary | ICD-10-CM | POA: Diagnosis not present

## 2019-06-24 DIAGNOSIS — Z3A35 35 weeks gestation of pregnancy: Secondary | ICD-10-CM

## 2019-06-24 DIAGNOSIS — O1490 Unspecified pre-eclampsia, unspecified trimester: Secondary | ICD-10-CM | POA: Diagnosis present

## 2019-06-24 LAB — COMPREHENSIVE METABOLIC PANEL
ALT: 13 U/L (ref 0–44)
AST: 24 U/L (ref 15–41)
Albumin: 2.8 g/dL — ABNORMAL LOW (ref 3.5–5.0)
Alkaline Phosphatase: 101 U/L (ref 38–126)
Anion gap: 9 (ref 5–15)
BUN: 9 mg/dL (ref 6–20)
CO2: 21 mmol/L — ABNORMAL LOW (ref 22–32)
Calcium: 9 mg/dL (ref 8.9–10.3)
Chloride: 105 mmol/L (ref 98–111)
Creatinine, Ser: 0.89 mg/dL (ref 0.44–1.00)
GFR calc Af Amer: >60 mL/min (ref >60)
GFR calc non Af Amer: >60 mL/min (ref >60)
Glucose, Bld: 75 mg/dL (ref 70–99)
Potassium: 3.9 mmol/L (ref 3.5–5.1)
Sodium: 135 mmol/L (ref 135–145)
Total Bilirubin: 0.5 mg/dL (ref 0.3–1.2)
Total Protein: 5.8 g/dL — ABNORMAL LOW (ref 6.5–8.1)

## 2019-06-24 LAB — SARS CORONAVIRUS 2 BY RT PCR (HOSPITAL ORDER, PERFORMED IN ~~LOC~~ HOSPITAL LAB): SARS Coronavirus 2: NEGATIVE

## 2019-06-24 LAB — PROTEIN / CREATININE RATIO, URINE
Creatinine, Urine: 85 mg/dL
Protein Creatinine Ratio: 0.34 mg/mg{Cre} — ABNORMAL HIGH (ref 0.00–0.15)
Total Protein, Urine: 29 mg/dL

## 2019-06-24 LAB — CBC
HCT: 38.7 % (ref 36.0–46.0)
Hemoglobin: 12.3 g/dL (ref 12.0–15.0)
MCH: 27.3 pg (ref 26.0–34.0)
MCHC: 31.8 g/dL (ref 30.0–36.0)
MCV: 86 fL (ref 80.0–100.0)
Platelets: 205 10*3/uL (ref 150–400)
RBC: 4.5 MIL/uL (ref 3.87–5.11)
RDW: 19 % — ABNORMAL HIGH (ref 11.5–15.5)
WBC: 11.9 10*3/uL — ABNORMAL HIGH (ref 4.0–10.5)
nRBC: 0 % (ref 0.0–0.2)

## 2019-06-24 LAB — GROUP B STREP BY PCR: Group B strep by PCR: NEGATIVE

## 2019-06-24 MED ORDER — HYDRALAZINE HCL 20 MG/ML IJ SOLN: 10.0000 mg | INTRAMUSCULAR | Status: DC | PRN

## 2019-06-24 MED ORDER — LABETALOL HCL 5 MG/ML IV SOLN
40.0000 mg | INTRAVENOUS | Status: DC | PRN
  Filled 2019-06-24: qty 8

## 2019-06-24 MED ORDER — LABETALOL HCL 5 MG/ML IV SOLN
80.0000 mg | INTRAVENOUS | Status: DC | PRN
  Filled 2019-06-24: qty 16

## 2019-06-24 MED ORDER — BETAMETHASONE SOD PHOS & ACET 6 (3-3) MG/ML IJ SUSP
INTRAMUSCULAR | Status: AC
  Filled 2019-06-24: qty 5

## 2019-06-24 MED ORDER — LABETALOL HCL 5 MG/ML IV SOLN
20.0000 mg | INTRAVENOUS | Status: DC | PRN
  Filled 2019-06-24: qty 4

## 2019-06-24 MED ORDER — BETAMETHASONE SOD PHOS & ACET 6 (3-3) MG/ML IJ SUSP
12.0000 mg | INTRAMUSCULAR | Status: AC
  Administered 2019-06-24 – 2019-06-25 (×2): 12 mg via INTRAMUSCULAR
  Filled 2019-06-24: qty 2

## 2019-06-24 MED ORDER — LABETALOL HCL 100 MG PO TABS
200.0000 mg | ORAL_TABLET | Freq: Two times a day (BID) | ORAL | Status: DC
  Administered 2019-06-24: 200 mg via ORAL
  Filled 2019-06-24: qty 2

## 2019-06-24 MED ORDER — ACETAMINOPHEN 500 MG PO TABS
1000.0000 mg | ORAL_TABLET | Freq: Four times a day (QID) | ORAL | Status: DC | PRN
  Administered 2019-06-24 – 2019-06-25 (×2): 1000 mg via ORAL
  Filled 2019-06-24 (×2): qty 2

## 2019-06-24 NOTE — OB Triage Note (Signed)
Patient reported to L&D with complaints of elevated blood pressures at home.  Last blood pressure at home was 178/120.  Denies headache, visual disturbances nausea/ vomiting or epigastric/ RUQ pain. No new swelling noted.

## 2019-06-24 NOTE — H&P (Signed)
OB History & Physical   History of Present Illness:  Chief Complaint: elevated BPs at home  HPI:  Lynn Bean is a 34 y.o. G1P0000 female at [redacted]w[redacted]d dated by LMP c/w [redacted]w[redacted]d ultrasound. She presents to L&D for elevated blood pressures at home, to the 160s-170s SBP.   She reports:  -active fetal movement -no leakage of fluid  -no vaginal bleeding -no contractions -b/l lower extremity edema, stable over the past few days -no headache, no visual changes, no N/V, no RUQ/epigastric pain  Pregnancy Issues: 1. Prepregnancy BMI 30 2. Size > dates, last growth on 06/05/2019 with EFW 2445g 71% 3. Sister with spina bifida, AFP negative for OSB 4. Gestational hypertension 5. AFP/Tetra positive for T21, cfDNA negative for T21 6. PUPPs 7. Lower extremity swelling, started on HCTZ 12.5mg  daily on 05/08/2019 8. Elevated 1h OGTT, repeat WNL   Maternal Medical History:   Past Medical History:  Diagnosis Date  . Acne   . ADD (attention deficit disorder)   . Depression   . 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 Ruptured disc, thoracic 2007    Past Surgical History:  Procedure Laterality Date  . NO PAST SURGERIES      Allergies  Allergen Reactions  . Benadryl [Diphenhydramine]     Only brand name benadryl    Prior to Admission medications   Medication Sig Start Date End Date Taking? Authorizing Provider  folic acid (FOLVITE) 1 MG tablet Take 4 mg by mouth daily.   Yes [provider]  hydrochlorothiazide (MICROZIDE) 12.5 MG capsule Take 12.5 mg by mouth daily.   Yes [provider]  Prenatal Vit-Fe Fumarate-FA (PRENATAL MULTIVITAMIN) TABS tablet Take 1 tablet by mouth daily at 12 noon.   Yes [provider]     Prenatal care site: [facility] [place] History: She  reports that she has never smoked. She has never used smokeless tobacco. She reports previous alcohol use. She reports that she  does not use drugs.  Family History: family history includes Hypertension in her father; Lymphoma in her paternal grandfather; Prostate cancer in her paternal grandfather.   Review of Systems: A full review of systems was performed and negative except as noted in the HPI.    Physical Exam:  Vital Signs: BP (!) 146/71   Pulse 88   Temp 98.1 F (36.7 C) (Oral)   Resp 19   Ht 5\' 6"  (1.676 m)   Wt 112.9 kg   LMP 10/18/2018   BMI 40.19 kg/m   General:   alert, cooperative, appears stated age and no distress  Skin:  normal and no rash or abnormalities  Neurologic:    Alert & oriented x 3  Lungs:   clear to auscultation bilaterally  Heart:   regular rate and rhythm, S1, S2 normal, no murmur, click, rub or gallop  Abdomen:  soft, non-tender; bowel sounds normal; no masses,  no organomegaly  Pelvis:  External genitalia: normal general appearance  FHT:  145 BPM  Presentations: cephalic  Cervix:    Dilation: Closed   Effacement: Long   Station:  Floating   Consistency: soft   Position: posterior  Extremities: : non-tender, symmetric, +2 edema bilaterally.     Results for orders placed or performed during the hospital encounter of 06/24/19 (from the past 24 hour(s))  Comprehensive metabolic panel     Status: Abnormal   Collection Time: 06/24/19  5:23 PM  Result Value Ref Range   Sodium 135 135 - 145 mmol/L   Potassium 3.9 3.5 - 5.1 mmol/L   Chloride 105 98 - 111 mmol/L   CO2 21 (L) 22 - 32 mmol/L   Glucose, Bld 75 70 - 99 mg/dL   BUN 9 6 - 20 mg/dL   Creatinine, Ser 0.89 0.44 - 1.00 mg/dL   Calcium 9.0 8.9 - 10.3 mg/dL   Total Protein 5.8 (L) 6.5 - 8.1 g/dL   Albumin 2.8 (L) 3.5 - 5.0 g/dL   AST 24 15 - 41 U/L   ALT 13 0 - 44 U/L   Alkaline Phosphatase 101 38 - 126 U/L   Total Bilirubin 0.5 0.3 - 1.2 mg/dL   GFR calc non Af Amer >60 >60 mL/min   GFR calc Af Amer >60 >60 mL/min   Anion gap 9 5 - 15  CBC on admission     Status: Abnormal   Collection Time: 06/24/19  5:23  PM  Result Value Ref Range   WBC 11.9 (H) 4.0 - 10.5 K/uL   RBC 4.50 3.87 - 5.11 MIL/uL   Hemoglobin 12.3 12.0 - 15.0 g/dL   HCT 38.7 36.0 - 46.0 %   MCV 86.0 80.0 - 100.0 fL   MCH 27.3 26.0 - 34.0 pg   MCHC 31.8 30.0 - 36.0 g/dL   RDW 19.0 (H) 11.5 - 15.5 %   Platelets 205 150 - 400 K/uL   nRBC 0.0 0.0 - 0.2 %  Protein / creatinine ratio, urine     Status: Abnormal   Collection Time: 06/24/19  5:36 PM  Result Value Ref Range   Creatinine, Urine 85 mg/dL   Total Protein, Urine 29 mg/dL   Protein Creatinine Ratio 0.34 (H) 0.00 - 0.15 mg/mg[Cre]    Pertinent Results:  Prenatal Labs: Blood type/Rh B+  Antibody screen neg  Rubella Immune  Varicella Immune  RPR NR  HBsAg Neg  HIV NR  GC neg  Chlamydia neg  Genetic screening negative  1 hour GTT 170, 127  3 hour GTT ---  GBS pending   FHT: FHR: 145 bpm, variability: moderate,  accelerations:  Present,  decelerations:  Absent Category/reactivity:  Category I TOCO: uterine irritability with some contractions, patient reports no contractions  Assessment:  Lynn Bean is a 33 y.o. G11P0000 female at [redacted]w[redacted]d with preterm preeclampsia.   Plan:   1. Preeclampsia, possible severe features: -Meets criteria based on elevated blood pressures and P/C ratio of 340. Other labs within normal and no symptoms of severe features.  -Patient reports severe range pressures at home, but she is concerned that blood pressure cuff is old and not the most accurate. One severe range pressure in triage, around the same time she received 200mg  labetalol PO.  -Plan to discontinue PO antihypertensives and continue close monitoring of blood pressures, at least every hour for now. Order set for IV labetalol PRN for persistent severe range BPs. -24 hour urine protein collection ordered.  -In the case of persistent severe range BPs, patient would meet criteria for severe features and delivery would be indicated, along with treatment of blood pressures  with IV antihypertensives and IV magnesium sulfate for eclampsia prophylaxis. If possible, it would be ideal to delay until 48 hours after start of betamethasone course for fetal lung maturity.   2. Fetal wellbeing:  -Fetal Tracing: category I -GBS pending -Presentation: cephalic confirmed by [facility]opold's -S/p one dose of betamethasone at 1934, plan repeat dose in 24  hours -Plan for NST q shift unless maternal status changes  3. Routine OB: -Prenatal labs reviewed, as above -Rh positive -COVID-19 test pending  4. Post Partum Planning: - Infant feeding: breastfeeding - Contraception: NuvaRing   Discussed patient history and presentation with Dr. Larey Days, who is in agreement with this plan. Plan to update Dr. Leonides Schanz as patient status changes and as appropriate.    Lynn Bean, CNM 06/24/2019 8:10 PM ----- Lynn Bean Certified Nurse Midwife [facility], Department of [facility]

## 2019-06-25 DIAGNOSIS — O1490 Unspecified pre-eclampsia, unspecified trimester: Secondary | ICD-10-CM | POA: Diagnosis present

## 2019-06-25 DIAGNOSIS — Z3A35 35 weeks gestation of pregnancy: Secondary | ICD-10-CM | POA: Diagnosis not present

## 2019-06-25 DIAGNOSIS — R030 Elevated blood-pressure reading, without diagnosis of hypertension: Secondary | ICD-10-CM | POA: Diagnosis present

## 2019-06-25 DIAGNOSIS — Z20828 Contact with and (suspected) exposure to other viral communicable diseases: Secondary | ICD-10-CM | POA: Diagnosis present

## 2019-06-25 DIAGNOSIS — O1403 Mild to moderate pre-eclampsia, third trimester: Secondary | ICD-10-CM | POA: Diagnosis present

## 2019-06-25 LAB — PROTEIN, URINE, 24 HOUR
Collection Interval-UPROT: 24 hours
Protein, 24H Urine: 386 mg/d — ABNORMAL HIGH (ref 50–100)
Protein, Urine: 15 mg/dL
Urine Total Volume-UPROT: 2570 mL

## 2019-06-25 MED ORDER — BETAMETHASONE SOD PHOS & ACET 6 (3-3) MG/ML IJ SUSP
INTRAMUSCULAR | Status: AC
  Filled 2019-06-25: qty 5

## 2019-06-25 MED ORDER — PRENATAL MULTIVITAMIN CH: 1.0000 | ORAL_TABLET | Freq: Every day | ORAL | Status: DC

## 2019-06-25 MED ORDER — ALUM & MAG HYDROXIDE-SIMETH 200-200-20 MG/5ML PO SUSP
30.0000 mL | Freq: Four times a day (QID) | ORAL | Status: DC | PRN
  Administered 2019-06-25 (×2): 30 mL via ORAL
  Filled 2019-06-25 (×2): qty 30

## 2019-06-25 MED ORDER — FOLIC ACID 1 MG PO TABS
4.0000 mg | ORAL_TABLET | Freq: Every day | ORAL | Status: DC
  Filled 2019-06-25: qty 4

## 2019-06-25 NOTE — Progress Notes (Signed)
Spoke to CNM and reported her last BP of 147/91. Joycelyn Schmid ok with discharging pt home. Orders to be placed per her

## 2019-06-25 NOTE — Progress Notes (Signed)
Patient to birthplace for daily NST.

## 2019-06-25 NOTE — Progress Notes (Signed)
NST  NST indication: preeclampsia without severe features  Objective:  BP 132/64   Pulse (!) 102   Temp 98.5 F (36.9 C) (Oral)   Resp 18   Ht 5\' 6"  (1.676 m)   Wt 112.9 kg   LMP 10/18/2018   SpO2 98% Comment: Room Air  BMI 40.19 kg/m   NST Baseline: 160bpm Variability: moderate Accelerations: 15x15 present x >2 Decelerations: absent Time: 10mins Toco: occasional contraction  Interpretation: reactive NST  Assessment:  33 y.o. [redacted]w[redacted]d with high risk pregnancy and antepartum surveillance.   Principle Diagnosis:  Preeclampsia without severe features  Plan:  Reactive NST  Fetal Wellbeing: Reassuring  Return to floor for blood pressure monitoring, continue NSTs q shift  ----- Lisette Grinder, CNM, WHNP-BC [facility], Department of [facility]

## 2019-06-25 NOTE — Discharge Summary (Signed)
Patient ID: LEAIRAH COONROD MRN: [id number] DOB/AGE: 01/08/1986 33 y.o.  Admit date: 06/24/2019 Discharge date: 06/25/2019  Admission Diagnoses: Elevated blood pressures at home  Discharge Diagnoses: Preeclampsia without severe features  Prenatal Procedures: NST  Consults: None  Significant Diagnostic Studies:  Results for orders placed or performed during the hospital encounter of 06/24/19 (from the past 168 hour(s))  Comprehensive metabolic panel   Collection Time: 06/24/19  5:23 PM  Result Value Ref Range   Sodium 135 135 - 145 mmol/L   Potassium 3.9 3.5 - 5.1 mmol/L   Chloride 105 98 - 111 mmol/L   CO2 21 (L) 22 - 32 mmol/L   Glucose, Bld 75 70 - 99 mg/dL   BUN 9 6 - 20 mg/dL   Creatinine, Ser 0.89 0.44 - 1.00 mg/dL   Calcium 9.0 8.9 - 10.3 mg/dL   Total Protein 5.8 (L) 6.5 - 8.1 g/dL   Albumin 2.8 (L) 3.5 - 5.0 g/dL   AST 24 15 - 41 U/L   ALT 13 0 - 44 U/L   Alkaline Phosphatase 101 38 - 126 U/L   Total Bilirubin 0.5 0.3 - 1.2 mg/dL   GFR calc non Af Amer >60 >60 mL/min   GFR calc Af Amer >60 >60 mL/min   Anion gap 9 5 - 15  CBC on admission   Collection Time: 06/24/19  5:23 PM  Result Value Ref Range   WBC 11.9 (H) 4.0 - 10.5 K/uL   RBC 4.50 3.87 - 5.11 MIL/uL   Hemoglobin 12.3 12.0 - 15.0 g/dL   HCT 38.7 36.0 - 46.0 %   MCV 86.0 80.0 - 100.0 fL   MCH 27.3 26.0 - 34.0 pg   MCHC 31.8 30.0 - 36.0 g/dL   RDW 19.0 (H) 11.5 - 15.5 %   Platelets 205 150 - 400 K/uL   nRBC 0.0 0.0 - 0.2 %  Protein / creatinine ratio, urine   Collection Time: 06/24/19  5:36 PM  Result Value Ref Range   Creatinine, Urine 85 mg/dL   Total Protein, Urine 29 mg/dL   Protein Creatinine Ratio 0.34 (H) 0.00 - 0.15 mg/mg[Cre]  Group B strep by PCR   Collection Time: 06/24/19  8:00 PM   Specimen: Vaginal/Rectal; Genital  Result Value Ref Range   Group B strep by PCR NEGATIVE NEGATIVE  SARS Coronavirus 2 [facility] order, Performed in [place] hospital lab)   Collection Time: 06/24/19   8:10 PM  Result Value Ref Range   SARS Coronavirus 2 NEGATIVE NEGATIVE  Results for orders placed or performed during the hospital encounter of 06/20/19 (from the past 168 hour(s))  Protein / creatinine ratio, urine   Collection Time: 06/20/19  3:55 PM  Result Value Ref Range   Creatinine, Urine 127 mg/dL   Total Protein, Urine 33 mg/dL   Protein Creatinine Ratio 0.26 (H) 0.00 - 0.15 mg/mg[Cre]  CBC on admission   Collection Time: 06/20/19  4:15 PM  Result Value Ref Range   WBC 10.7 (H) 4.0 - 10.5 K/uL   RBC 4.25 3.87 - 5.11 MIL/uL   Hemoglobin 11.6 (L) 12.0 - 15.0 g/dL   HCT 35.7 (L) 36.0 - 46.0 %   MCV 84.0 80.0 - 100.0 fL   MCH 27.3 26.0 - 34.0 pg   MCHC 32.5 30.0 - 36.0 g/dL   RDW 18.8 (H) 11.5 - 15.5 %   Platelets 213 150 - 400 K/uL   nRBC 0.0 0.0 - 0.2 %  Comprehensive metabolic panel  Collection Time: 06/20/19  4:15 PM  Result Value Ref Range   Sodium 134 (L) 135 - 145 mmol/L   Potassium 3.9 3.5 - 5.1 mmol/L   Chloride 105 98 - 111 mmol/L   CO2 21 (L) 22 - 32 mmol/L   Glucose, Bld 94 70 - 99 mg/dL   BUN 9 6 - 20 mg/dL   Creatinine, Ser 0.88 0.44 - 1.00 mg/dL   Calcium 9.4 8.9 - 10.3 mg/dL   Total Protein 6.0 (L) 6.5 - 8.1 g/dL   Albumin 3.0 (L) 3.5 - 5.0 g/dL   AST 21 15 - 41 U/L   ALT 15 0 - 44 U/L   Alkaline Phosphatase 104 38 - 126 U/L   Total Bilirubin 0.3 0.3 - 1.2 mg/dL   GFR calc non Af Amer >60 >60 mL/min   GFR calc Af Amer >60 >60 mL/min   Anion gap 8 5 - 15  Sample to Blood Bank   Collection Time: 06/20/19  4:15 PM  Result Value Ref Range   Blood Bank Specimen SAMPLE AVAILABLE FOR TESTING    Sample Expiration      06/23/2019,2359 Performed at [facility], [address], [place], [place] [address]     Treatments: steroids: betamethasone  Hospital Course:  This is a 33 y.o. G1P0000 with IUP at [redacted]w[redacted]d admitted for elevated blood pressures at home. She had one severe range blood pressure in L&D triage of 161/86. She was given one dose  of 200mg  labetalol PO around that same time. She was given no further antihypertensives, PO or IV. She had no signs or symptoms of severe features of preeclampsia. P/C ratio was elevated, but other labs did not reveal evidence of severe features of preeclampsia.   24 hour urine protein collection was collected. Course of betamethasone was completed for fetal lung maturity. Blood pressures became normal range with occasional mild range blood pressure for 24 hours. No moderate or severe range blood pressures. She developed no headache, no visual changes, no nausea or vomiting, and no RUQ or epigastric pain. Baby remained reactive on NST.  Presentation and course was discussed with Dr. Larey Days, who agreed that she is stable for discharge to home with outpatient follow up. Induction of labor is scheduled for [redacted]w[redacted]d GA.   Discharge Physical Exam:  BP (!) 147/91   Pulse 93   Temp 98.2 F (36.8 C) (Oral)   Resp 20   Ht 5\' 6"  (1.676 m)   Wt 112.9 kg   LMP 10/18/2018   SpO2 100%   BMI 40.19 kg/m    Constitutional: NAD, AAOx3  HE/ENT: extraocular movements grossly intact, moist mucous membranes CV: RRR PULM: nl respiratory effort, CTABL    Abd: gravid, non-tender, non-distended, soft  Ext: Non-tender, Nonedmeatous   Psych: mood appropriate, speech normal Pelvic: deferred     NST Baseline: 155bpm Variability: moderate Accelerations: 15x15 present x >2 Decelerations: absent Time: 42mins  Interpretation: reactive NST, category 1 tracing   Discharge Condition: Stable  Disposition: Discharge disposition: 01-Home or Self Care        Allergies as of 06/25/2019      Reactions   Benadryl [diphenhydramine]    Only brand name benadryl      Medication List    STOP taking these medications   hydrochlorothiazide 12.5 MG capsule Commonly known as: MICROZIDE     TAKE these medications   folic acid 1 MG tablet Commonly known as: FOLVITE Take 4 mg by mouth daily.  prenatal  multivitamin Tabs tablet Take 1 tablet by mouth daily at 12 noon.      Follow-up Information    Schermerhorn, Gwen Her, MD .   Specialty: Obstetrics and Gynecology Contact information: [address] [place] [address] [phone]           Signed:  Lisette Grinder 06/25/2019 10:13 PM ----- Lisette Grinder, CNM Certified Nurse Midwife [facility]

## 2019-06-25 NOTE — Progress Notes (Signed)
Patient back to unit from NST. 1100 blood pressure missed because patient went to restroom and took blood pressure cuff off. Dinamap set to repeat BP q1h. Dinamap reset while in birthplace and did not run blood pressure. Blood pressure taken upon arrival back to unit and dinamap reprogrammed to repeat BP q1h. BP Stable and WDL upon arrival back to unit. RN to continue to monitor.  Patient asking if she needs to continue taking her HCTZ and if she should take her prenatal and folic acid. CNM, M. Haviland, updated. Per CNM, patient can continue prenatal and folic acid but does not need to take HCTZ at this time. Patient updated and verbalizes understanding. RN explained that medications should come from pharmacy instead of patient taking her own for safety reasons. Patient verbalizes understanding. RN will speak with CNM about ordering these medications through the pharmacy for the future.

## 2019-06-25 NOTE — Progress Notes (Signed)
ANTEPARTUM PROGRESS NOTE  Lynn Bean is a 33 y.o. G1P0000 at [redacted]w[redacted]d who is admitted for preeclampsia, concern for severe features.   Estimated Date of Delivery: 07/25/19  Length of Stay:  0 Days. Admitted 06/24/2019  Subjective: Got some sleep overnight.  She reports:  -active fetal movement -no leakage of fluid -no vaginal bleeding -no contractions -no headache, visual changes, N/V, RUQ pain, or epigastric pain  Vitals:  BP 137/74 (BP Location: Left Arm)   Pulse 97   Temp 98.5 F (36.9 C) (Oral)   Resp 18   Ht 5\' 6"  (1.676 m)   Wt 112.9 kg   LMP 10/18/2018   SpO2 98% Comment: Room Air  BMI 40.19 kg/m  Physical Examination: General:   alert, cooperative and appears stated age  Skin:  normal and no rash or abnormalities  Neurologic:    Alert & oriented x 3  Lungs:   clear to auscultation bilaterally  Heart:   regular rate and rhythm, S1, S2 normal, no murmur, click, rub or gallop  Abdomen:  soft, non-tender; bowel sounds normal; no masses,  no organomegaly  Pelvis:  Exam deferred.  Extremities: : non-tender, symmetric, +2 LE edema bilaterally.      Results for orders placed or performed during the hospital encounter of 06/24/19 (from the past 48 hour(s))  Comprehensive metabolic panel     Status: Abnormal   Collection Time: 06/24/19  5:23 PM  Result Value Ref Range   Sodium 135 135 - 145 mmol/L   Potassium 3.9 3.5 - 5.1 mmol/L   Chloride 105 98 - 111 mmol/L   CO2 21 (L) 22 - 32 mmol/L   Glucose, Bld 75 70 - 99 mg/dL   BUN 9 6 - 20 mg/dL   Creatinine, Ser 0.89 0.44 - 1.00 mg/dL   Calcium 9.0 8.9 - 10.3 mg/dL   Total Protein 5.8 (L) 6.5 - 8.1 g/dL   Albumin 2.8 (L) 3.5 - 5.0 g/dL   AST 24 15 - 41 U/L   ALT 13 0 - 44 U/L   Alkaline Phosphatase 101 38 - 126 U/L   Total Bilirubin 0.5 0.3 - 1.2 mg/dL   GFR calc non Af Amer >60 >60 mL/min   GFR calc Af Amer >60 >60 mL/min   Anion gap 9 5 - 15    Comment: Performed at [facility], [address] [place], [place] [address]  CBC on admission     Status: Abnormal   Collection Time: 06/24/19  5:23 PM  Result Value Ref Range   WBC 11.9 (H) 4.0 - 10.5 K/uL   RBC 4.50 3.87 - 5.11 MIL/uL   Hemoglobin 12.3 12.0 - 15.0 g/dL   HCT 38.7 36.0 - 46.0 %   MCV 86.0 80.0 - 100.0 fL   MCH 27.3 26.0 - 34.0 pg   MCHC 31.8 30.0 - 36.0 g/dL   RDW 19.0 (H) 11.5 - 15.5 %   Platelets 205 150 - 400 K/uL   nRBC 0.0 0.0 - 0.2 %    Comment: Performed at [facility], [address] [place], [place] [address]  Protein / creatinine ratio, urine     Status: Abnormal   Collection Time: 06/24/19  5:36 PM  Result Value Ref Range   Creatinine, Urine 85 mg/dL   Total Protein, Urine 29 mg/dL    Comment: NO NORMAL RANGE ESTABLISHED FOR THIS TEST   Protein Creatinine Ratio 0.34 (H) 0.00 - 0.15 mg/mg[Cre]    Comment:  Performed at [facility], [address] [place], [place] [address]  Group B strep by PCR     Status: None   Collection Time: 06/24/19  8:00 PM   Specimen: Vaginal/Rectal; Genital  Result Value Ref Range   Group B strep by PCR NEGATIVE NEGATIVE    Comment: (NOTE) Intrapartum testing with Xpert GBS assay should be used as an adjunct to other methods available and not used to replace antepartum testing (at 35-[redacted] weeks gestation). Performed at [facility], [address] [place], [place] [address]   SARS Coronavirus 2 [facility] order, Performed in [facility] hospital lab)     Status: None   Collection Time: 06/24/19  8:10 PM  Result Value Ref Range   SARS Coronavirus 2 NEGATIVE NEGATIVE    Comment: Performed at [facility], [address] [place], [place] [address]    No results found.  Current scheduled medications . betamethasone acetate-betamethasone sodium phosphate  12 mg Intramuscular Q24 Hr x 2    I have reviewed the patient's current medications.  ASSESSMENT: Patient Active Problem List   Diagnosis Date Noted  . Gestational  hypertension 06/24/2019  . Elevated blood pressure affecting pregnancy in third trimester, antepartum 06/20/2019  . Abnormal maternal serum screening test   . Family history of spina bifida   . Attention deficit hyperactivity disorder (ADHD) 06/05/2016    PLAN:  1. Preeclampsia, possible severe features: -Monitoring blood pressure hourly. Patient with normotensive blood pressures overnight. Continue to monitor.  -24 hour urine protein collection underway.   2. Fetal wellbeing:  -GBS negative -Presentation: cephalic, confirmed by Leopold's -S/p one dose of betamethasone at 1934 yesrerday, plan repeat dose today at [place] for NST q shift unless maternal status changes  3. Maternal wellbeing: -Patient feeling well, no other concerns right now.      Lisette Grinder, [place] 06/25/2019 9:48 AM  Lisette Grinder Certified Nurse Midwife [facility]

## 2019-06-25 NOTE — Discharge Instructions (Signed)
Blood pressure monitoring: -Check blood pressure every 2 hours while awake. Keep a log of your blood pressures.  -Please call [phone] [clinic M-F 8:30a-4:30p] or [phone] [L&D] if: --the top number is 150 or higher or if the bottom number is 100 or higher  --you have any of the following symptoms: ---Swelling of the face or hands ---Headache that will not go away ---Blurry vision, white or black spots in your vision, bright lights/stars in your vision ---Pain on the right, upper side of your abdomen that aches and is not from the baby's feet kicking you ---Pain in the upper, middle of your abdomen, directly under your ribcage ---Nausea or vomiting/unable to keep food or liquids down ---Difficulty breathing  *Please come in if the top number is 160 or higher or if the bottom number is 110 or higher.   If you have any questions at all, please reach out. We will plan for you to be seen in the office on Monday or Tuesday. Someone should call you to schedule this visit.

## 2019-06-26 DIAGNOSIS — O280 Abnormal hematological finding on antenatal screening of mother: Secondary | ICD-10-CM | POA: Diagnosis not present

## 2019-06-26 DIAGNOSIS — O10913 Unspecified pre-existing hypertension complicating pregnancy, third trimester: Secondary | ICD-10-CM | POA: Diagnosis not present

## 2019-06-26 DIAGNOSIS — O0993 Supervision of high risk pregnancy, unspecified, third trimester: Secondary | ICD-10-CM | POA: Diagnosis not present

## 2019-06-27 LAB — RPR: RPR Ser Ql: NONREACTIVE

## 2019-06-28 ENCOUNTER — Inpatient Hospital Stay: Payer: BC Managed Care – PPO | Admitting: Anesthesiology

## 2019-06-28 ENCOUNTER — Inpatient Hospital Stay
Admission: EM | Admit: 2019-06-28 | Discharge: 2019-07-02 | DRG: 788 | Disposition: A | Discharge status: Home / Self Care | Payer: BC Managed Care – PPO | Attending: Obstetrics and Gynecology | Admitting: Obstetrics and Gynecology

## 2019-06-28 ENCOUNTER — Other Ambulatory Visit: Payer: Self-pay

## 2019-06-28 DIAGNOSIS — O2686 Pruritic urticarial papules and plaques of pregnancy (PUPPP): Secondary | ICD-10-CM | POA: Diagnosis present

## 2019-06-28 DIAGNOSIS — Z3490 Encounter for supervision of normal pregnancy, unspecified, unspecified trimester: Secondary | ICD-10-CM | POA: Diagnosis present

## 2019-06-28 DIAGNOSIS — Z20828 Contact with and (suspected) exposure to other viral communicable diseases: Secondary | ICD-10-CM | POA: Diagnosis not present

## 2019-06-28 DIAGNOSIS — O1414 Severe pre-eclampsia complicating childbirth: Principal | ICD-10-CM | POA: Diagnosis present

## 2019-06-28 DIAGNOSIS — R0602 Shortness of breath: Secondary | ICD-10-CM | POA: Diagnosis present

## 2019-06-28 DIAGNOSIS — R000 Tachycardia, unspecified: Secondary | ICD-10-CM | POA: Diagnosis not present

## 2019-06-28 DIAGNOSIS — R609 Edema, unspecified: Secondary | ICD-10-CM | POA: Diagnosis not present

## 2019-06-28 DIAGNOSIS — G8918 Other acute postprocedural pain: Secondary | ICD-10-CM | POA: Diagnosis not present

## 2019-06-28 DIAGNOSIS — O26833 Pregnancy related renal disease, third trimester: Secondary | ICD-10-CM | POA: Diagnosis not present

## 2019-06-28 DIAGNOSIS — O134 Gestational [pregnancy-induced] hypertension without significant proteinuria, complicating childbirth: Secondary | ICD-10-CM | POA: Diagnosis not present

## 2019-06-28 DIAGNOSIS — Z3A36 36 weeks gestation of pregnancy: Secondary | ICD-10-CM | POA: Diagnosis not present

## 2019-06-28 DIAGNOSIS — O339 Maternal care for disproportion, unspecified: Secondary | ICD-10-CM | POA: Diagnosis not present

## 2019-06-28 DIAGNOSIS — O1494 Unspecified pre-eclampsia, complicating childbirth: Secondary | ICD-10-CM | POA: Diagnosis not present

## 2019-06-28 DIAGNOSIS — R1084 Generalized abdominal pain: Secondary | ICD-10-CM | POA: Diagnosis not present

## 2019-06-28 DIAGNOSIS — O1413 Severe pre-eclampsia, third trimester: Secondary | ICD-10-CM | POA: Diagnosis not present

## 2019-06-28 DIAGNOSIS — O9989 Other specified diseases and conditions complicating pregnancy, childbirth and the puerperium: Secondary | ICD-10-CM | POA: Diagnosis not present

## 2019-06-28 DIAGNOSIS — R7989 Other specified abnormal findings of blood chemistry: Secondary | ICD-10-CM | POA: Diagnosis not present

## 2019-06-28 DIAGNOSIS — O1415 Severe pre-eclampsia, complicating the puerperium: Secondary | ICD-10-CM | POA: Diagnosis not present

## 2019-06-28 DIAGNOSIS — O1493 Unspecified pre-eclampsia, third trimester: Secondary | ICD-10-CM

## 2019-06-28 LAB — COMPREHENSIVE METABOLIC PANEL
ALT: 28 U/L (ref 0–44)
AST: 37 U/L (ref 15–41)
Albumin: 3.1 g/dL — ABNORMAL LOW (ref 3.5–5.0)
Alkaline Phosphatase: 112 U/L (ref 38–126)
Anion gap: 10 (ref 5–15)
BUN: 17 mg/dL (ref 6–20)
CO2: 20 mmol/L — ABNORMAL LOW (ref 22–32)
Calcium: 8.4 mg/dL — ABNORMAL LOW (ref 8.9–10.3)
Chloride: 106 mmol/L (ref 98–111)
Creatinine, Ser: 0.94 mg/dL (ref 0.44–1.00)
GFR calc Af Amer: >60 mL/min (ref >60)
GFR calc non Af Amer: >60 mL/min (ref >60)
Glucose, Bld: 92 mg/dL (ref 70–99)
Potassium: 3.5 mmol/L (ref 3.5–5.1)
Sodium: 136 mmol/L (ref 135–145)
Total Bilirubin: 0.4 mg/dL (ref 0.3–1.2)
Total Protein: 6.4 g/dL — ABNORMAL LOW (ref 6.5–8.1)

## 2019-06-28 LAB — CBC
HCT: 35.4 % — ABNORMAL LOW (ref 36.0–46.0)
HCT: 33 % — ABNORMAL LOW (ref 36.0–46.0)
Hemoglobin: 11.4 g/dL — ABNORMAL LOW (ref 12.0–15.0)
Hemoglobin: 10.6 g/dL — ABNORMAL LOW (ref 12.0–15.0)
MCH: 27.7 pg (ref 26.0–34.0)
MCH: 27.5 pg (ref 26.0–34.0)
MCHC: 32.2 g/dL (ref 30.0–36.0)
MCHC: 32.1 g/dL (ref 30.0–36.0)
MCV: 86.1 fL (ref 80.0–100.0)
MCV: 85.5 fL (ref 80.0–100.0)
Platelets: 199 10*3/uL (ref 150–400)
Platelets: 180 10*3/uL (ref 150–400)
RBC: 4.11 MIL/uL (ref 3.87–5.11)
RBC: 3.86 MIL/uL — ABNORMAL LOW (ref 3.87–5.11)
RDW: 18.9 % — ABNORMAL HIGH (ref 11.5–15.5)
RDW: 19.3 % — ABNORMAL HIGH (ref 11.5–15.5)
WBC: 17.5 10*3/uL — ABNORMAL HIGH (ref 4.0–10.5)
WBC: 15.6 10*3/uL — ABNORMAL HIGH (ref 4.0–10.5)
nRBC: 0.3 % — ABNORMAL HIGH (ref 0.0–0.2)
nRBC: 0.3 % — ABNORMAL HIGH (ref 0.0–0.2)

## 2019-06-28 LAB — TYPE AND SCREEN
ABO/RH(D): B POS
Antibody Screen: NEGATIVE

## 2019-06-28 LAB — PROTEIN / CREATININE RATIO, URINE
Creatinine, Urine: 112 mg/dL
Protein Creatinine Ratio: 0.32 mg/mg{Cre} — ABNORMAL HIGH (ref 0.00–0.15)
Total Protein, Urine: 36 mg/dL

## 2019-06-28 LAB — SARS CORONAVIRUS 2 BY RT PCR (HOSPITAL ORDER, PERFORMED IN ~~LOC~~ HOSPITAL LAB): SARS Coronavirus 2: NEGATIVE

## 2019-06-28 MED ORDER — FENTANYL 2.5 MCG/ML W/ROPIVACAINE 0.15% IN NS 100 ML EPIDURAL (ARMC)
EPIDURAL | Status: DC | PRN
  Administered 2019-06-28: 12 mL/h via EPIDURAL
  Administered 2019-06-29: 250 ug via EPIDURAL

## 2019-06-28 MED ORDER — MISOPROSTOL 25 MCG QUARTER TABLET
25.0000 ug | ORAL_TABLET | ORAL | Status: DC | PRN
  Administered 2019-06-28: 08:00:00 25 ug via BUCCAL
  Filled 2019-06-28 (×2): qty 1

## 2019-06-28 MED ORDER — MAGNESIUM SULFATE 40 G IN LACTATED RINGERS - SIMPLE
1.0000 g/h | INTRAVENOUS | Status: DC
  Administered 2019-06-29: 11:00:00 1 g/h via INTRAVENOUS
  Filled 2019-06-28 (×3): qty 500

## 2019-06-28 MED ORDER — LIDOCAINE-EPINEPHRINE (PF) 1.5 %-1:200000 IJ SOLN
INTRAMUSCULAR | Status: DC | PRN
  Administered 2019-06-28 (×2): 4 mL via PERINEURAL

## 2019-06-28 MED ORDER — ONDANSETRON HCL 4 MG/2ML IJ SOLN
4.0000 mg | Freq: Four times a day (QID) | INTRAMUSCULAR | Status: DC | PRN
  Administered 2019-06-29 (×2): 4 mg via INTRAVENOUS

## 2019-06-28 MED ORDER — LABETALOL HCL 5 MG/ML IV SOLN
INTRAVENOUS | Status: AC
  Filled 2019-06-28: qty 12

## 2019-06-28 MED ORDER — EPHEDRINE 5 MG/ML INJ: 10.0000 mg | INTRAVENOUS | Status: DC | PRN

## 2019-06-28 MED ORDER — LIDOCAINE HCL (PF) 1 % IJ SOLN
INTRAMUSCULAR | Status: DC | PRN
  Administered 2019-06-28: 4 mL via INTRADERMAL

## 2019-06-28 MED ORDER — LACTATED RINGERS IV SOLN
INTRAVENOUS | Status: DC
  Administered 2019-06-28: 06:00:00 via INTRAVENOUS

## 2019-06-28 MED ORDER — BUTORPHANOL TARTRATE 1 MG/ML IJ SOLN
1.0000 mg | INTRAMUSCULAR | Status: DC | PRN
  Administered 2019-06-28 (×2): 1 mg via INTRAVENOUS
  Filled 2019-06-28 (×2): qty 1

## 2019-06-28 MED ORDER — ACETAMINOPHEN 325 MG PO TABS: 650.0000 mg | ORAL_TABLET | ORAL | Status: DC | PRN

## 2019-06-28 MED ORDER — SOD CITRATE-CITRIC ACID 500-334 MG/5ML PO SOLN
30.0000 mL | ORAL | Status: DC | PRN
  Administered 2019-06-29: 17:00:00 30 mL via ORAL
  Filled 2019-06-28: qty 30

## 2019-06-28 MED ORDER — PHENYLEPHRINE 40 MCG/ML (10ML) SYRINGE FOR IV PUSH (FOR BLOOD PRESSURE SUPPORT): 80.0000 ug | PREFILLED_SYRINGE | INTRAVENOUS | Status: DC | PRN

## 2019-06-28 MED ORDER — LABETALOL HCL 5 MG/ML IV SOLN
80.0000 mg | INTRAVENOUS | Status: DC | PRN
  Filled 2019-06-28: qty 16

## 2019-06-28 MED ORDER — LABETALOL HCL 5 MG/ML IV SOLN
20.0000 mg | INTRAVENOUS | Status: DC | PRN
  Administered 2019-06-28 (×2): 20 mg via INTRAVENOUS
  Filled 2019-06-28 (×4): qty 4

## 2019-06-28 MED ORDER — OXYTOCIN 10 UNIT/ML IJ SOLN
INTRAMUSCULAR | Status: AC
  Filled 2019-06-28: qty 2

## 2019-06-28 MED ORDER — LIDOCAINE HCL (PF) 1 % IJ SOLN: 30.0000 mL | INTRAMUSCULAR | Status: DC | PRN

## 2019-06-28 MED ORDER — DIPHENHYDRAMINE HCL 50 MG/ML IJ SOLN: 12.5000 mg | INTRAMUSCULAR | Status: DC | PRN

## 2019-06-28 MED ORDER — LACTATED RINGERS IV SOLN
500.0000 mL | Freq: Once | INTRAVENOUS | Status: AC
  Administered 2019-06-29: 17:00:00 via INTRAVENOUS

## 2019-06-28 MED ORDER — CALCIUM GLUCONATE 10 % IV SOLN
INTRAVENOUS | Status: AC
  Filled 2019-06-28: qty 10

## 2019-06-28 MED ORDER — MISOPROSTOL 200 MCG PO TABS
ORAL_TABLET | ORAL | Status: AC
  Administered 2019-06-28: 08:00:00 25 ug via BUCCAL
  Filled 2019-06-28: qty 4

## 2019-06-28 MED ORDER — LABETALOL HCL 5 MG/ML IV SOLN
40.0000 mg | INTRAVENOUS | Status: DC | PRN
  Administered 2019-06-28: 13:00:00 40 mg via INTRAVENOUS
  Filled 2019-06-28 (×2): qty 8

## 2019-06-28 MED ORDER — FENTANYL-BUPIVACAINE-NACL 0.5-0.125-0.9 MG/250ML-% EP SOLN: 12.0000 mL/h | EPIDURAL | Status: DC | PRN

## 2019-06-28 MED ORDER — BUPIVACAINE HCL (PF) 0.25 % IJ SOLN
INTRAMUSCULAR | Status: DC | PRN
  Administered 2019-06-28: 3 mL via EPIDURAL
  Administered 2019-06-28: 5 mL via EPIDURAL

## 2019-06-28 MED ORDER — OXYTOCIN BOLUS FROM INFUSION: 500.0000 mL | Freq: Once | INTRAVENOUS | Status: DC

## 2019-06-28 MED ORDER — FUROSEMIDE 10 MG/ML IJ SOLN
20.0000 mg | Freq: Once | INTRAMUSCULAR | Status: AC
  Administered 2019-06-28: 09:00:00 20 mg via INTRAVENOUS
  Filled 2019-06-28 (×2): qty 2

## 2019-06-28 MED ORDER — HYDRALAZINE HCL 20 MG/ML IJ SOLN: 10.0000 mg | INTRAMUSCULAR | Status: DC | PRN

## 2019-06-28 MED ORDER — AMMONIA AROMATIC IN INHA
RESPIRATORY_TRACT | Status: AC
  Filled 2019-06-28: qty 10

## 2019-06-28 MED ORDER — MISOPROSTOL 25 MCG QUARTER TABLET
25.0000 ug | ORAL_TABLET | ORAL | Status: DC | PRN
  Administered 2019-06-28: 08:00:00 25 ug via VAGINAL
  Filled 2019-06-28 (×2): qty 1

## 2019-06-28 MED ORDER — MAGNESIUM SULFATE BOLUS VIA INFUSION
4.0000 g | Freq: Once | INTRAVENOUS | Status: AC
  Administered 2019-06-28: 06:00:00 4 g via INTRAVENOUS
  Filled 2019-06-28: qty 500

## 2019-06-28 MED ORDER — FENTANYL 2.5 MCG/ML W/ROPIVACAINE 0.15% IN NS 100 ML EPIDURAL (ARMC)
EPIDURAL | Status: AC
  Filled 2019-06-28: qty 100

## 2019-06-28 MED ORDER — OXYTOCIN 40 UNITS IN NORMAL SALINE INFUSION - SIMPLE MED
2.5000 [IU]/h | INTRAVENOUS | Status: DC
  Administered 2019-06-29: 18:00:00 1000 mL via INTRAVENOUS
  Filled 2019-06-28: qty 1000

## 2019-06-28 MED ORDER — TERBUTALINE SULFATE 1 MG/ML IJ SOLN: 0.2500 mg | Freq: Once | INTRAMUSCULAR | Status: DC | PRN

## 2019-06-28 MED ORDER — OXYTOCIN 40 UNITS IN NORMAL SALINE INFUSION - SIMPLE MED
1.0000 m[IU]/min | INTRAVENOUS | Status: DC
  Administered 2019-06-28: 18:00:00 1 m[IU]/min via INTRAVENOUS
  Filled 2019-06-28: qty 1000

## 2019-06-28 MED ORDER — LACTATED RINGERS IV SOLN
500.0000 mL | INTRAVENOUS | Status: DC | PRN
  Administered 2019-06-29: 12:00:00 250 mL via INTRAVENOUS

## 2019-06-28 MED ORDER — LIDOCAINE HCL (PF) 1 % IJ SOLN
INTRAMUSCULAR | Status: AC
  Filled 2019-06-28: qty 30

## 2019-06-28 NOTE — Anesthesia Preprocedure Evaluation (Addendum)
Anesthesia Evaluation  Patient identified by MRN, date of birth, ID band Patient awake    Reviewed: Allergy & Precautions, H&P , NPO status , Patient's Chart, lab work & pertinent test results, reviewed documented beta blocker date and time   History of Anesthesia Complications Negative for: history of anesthetic complications  Airway Mallampati: III  TM Distance: <3 FB Neck ROM: full    Dental no notable dental hx. (+) Chipped   Pulmonary Current Smoker and Patient abstained from smoking.,    Pulmonary exam normal breath sounds clear to auscultation       Cardiovascular Exercise Tolerance: Good hypertension, On Medications  Rhythm:regular Rate:Normal     Neuro/Psych PSYCHIATRIC DISORDERS Depression negative neurological ROS     GI/Hepatic negative GI ROS, Neg liver ROS,   Endo/Other  diabetes, Gestational  Renal/GU      Musculoskeletal   Abdominal   Peds  Hematology negative hematology ROS (+)   Anesthesia Other Findings Past Medical History: No date: Acne No date: ADD (attention deficit disorder) No date: Depression No date: 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     Comment:  Completed See Immunization list 2007: Ruptured disc, thoracic  BMI    Body Mass Index: 40.35 kg/m      Reproductive/Obstetrics (+) Pregnancy                            Anesthesia Physical Anesthesia Plan  ASA: III and emergent  Anesthesia Plan: Epidural   Post-op Pain Management:    Induction:   PONV Risk Score and Plan:   Airway Management Planned:   Additional Equipment:   Intra-op Plan:   Post-operative Plan:   Informed Consent: I have reviewed the patients History and Physical, chart, labs and discussed the procedure including the risks, benefits and alternatives for the proposed anesthesia with the patient or authorized representative who has indicated his/her  understanding and acceptance.       Plan Discussed with:   Anesthesia Plan Comments:        Anesthesia Quick Evaluation

## 2019-06-28 NOTE — H&P (Signed)
OB ADMISSION/ HISTORY & PHYSICAL:  Admission Date: 06/28/2019  3:25 AM  Admit Diagnosis: Shortness of breath  Lynn Bean is a 33 y.o. G1P0000 presenting at [redacted]w[redacted]d with SOB worse with supine lying, pitting edema to the knees and no fetal concerns.  Last two weeks with elevated BP in the mild range with increased P:C ratio >300 over baseline. No headache. NST reactive. Outpatient management for PreE without severe features   Prenatal History: G1P0000   EDC : 07/25/2019, by Last Menstrual Period  [facility]atal [facility] at [facility]  Prenatal course complicated by  - expectant outpt management of PreE - 1. Prepregnancy BMI 30  A1C [ 5.1 ], early GTT [ 70 ] 2. Size>dates  05/08/19 - @ 28+6 AUA = 31+1 = 84%ile  [x ]growth scan at 32 weeks 3. Sister with baby with spina bifida  AFP/Tetra: negative for OSB 4. -->preeclampsia without severe features  01/09/19 baseline CMP: [ wnl ] P/C ratio: [ 48 ]  Hospitalized 8/22-8/23  P/C ratio 340, 24 hour urine protein positive at 386  serial blood pressures normal to mild range  [x ] twice weekly NST, twice weekly labs, weekly AFI  IOL scheduled for 07/05/2019 at 0001 at [redacted]w[redacted]d  Patient and husband counseled that delivery would be indicated sooner if her blood pressures become severe, other reasons  5. AFP/Tetra positive for T21  risk 1:101  cfDNA 5/6- screen negative  Referral to [facility] 5/6, seen 5/21: negative [place] results and cfDNA  MFM recommended 3rd tri growth due to elevated Hcg levels 6. PUPPS  oral benadryl, topical benadryl. May need hydrocortisone 2.5% in future. 7. LE swelling  started HCTZ 12.5 05/08/19 8. Elevated 1h OGTT  7/10: 170  Desires repeat, okay per CCW. If increased, treat as gestational diabetic.   Repeat 7/20 (127)    Medical / Surgical History :  Past medical history:  Past Medical History:  Diagnosis Date  . Acne   . ADD (attention deficit disorder)   . Depression   . Human papilloma virus (HPV)  type 9 vaccine administered    Completed See Immunization list  . Ruptured disc, thoracic 2007     Past surgical history:  Past Surgical History:  Procedure Laterality Date  . NO PAST SURGERIES      Family History:  Family History  Problem Relation Age of Onset  . Hypertension Father   . Prostate cancer Paternal Grandfather   . Lymphoma Paternal Grandfather      Social History:  reports that she has never smoked. She has never used smokeless tobacco. She reports previous alcohol use. She reports that she does not use drugs.   Allergies: Benadryl [diphenhydramine]    Current Medications at time of admission:  Prior to Admission medications   Medication Sig Start Date End Date Taking? Authorizing Provider  folic acid (FOLVITE) 1 MG tablet Take 4 mg by mouth daily.   Yes [provider]  Prenatal Vit-Fe Fumarate-FA (PRENATAL MULTIVITAMIN) TABS tablet Take 1 tablet by mouth daily at 12 noon.   Yes [provider]     Review of Systems: Active FM +dyspnea, no palpatations  Headaches, occasionally not relieved with headaches this week No RUQ pain No voiding concerns No chest pain o  Physical Exam:  VS: Blood pressure (!) 155/81, pulse 99, temperature 98.6 F (37 C), temperature source Oral, resp. rate 16, height 5\' 6"  (1.676 m), weight 113.4 kg, last menstrual period 10/18/2018, SpO2 95 %.  General: alert and  oriented, appears NAD Heart: RRR Lungs: Crackles in RLL, decreased breath sounds in bilateral bases Abdomen: Gravid, soft and non-tender, non-distended Extremities: 2+pitting edema to the knees Face: no facial edema  FHT: 145, moderate variability, +accels, no decels TOCO: none SVE:  Dilation: Closed / Effacement (%): 60 / Station: -3    Cephalic by leopolds  Prenatal Labs: Blood type/Rh B/Positive/-- (03/09 0000)  Antibody screen neg  Rubella Immune  Varicella Immune  RPR NR  HBsAg Neg  HIV NR  GC neg  Chlamydia neg  Genetic  screening negative  1 hour GTT 127  3 hour GTT n/a  GBS neg   No results found.  Assessment: [redacted]w[redacted]d weeks gestation FHR category 1 PreE with severe features  Plan:  Admit for induction of labor Magnesium sulfate infusion  SOB, with O2 sats to 93%: Lasix x1, low threshold for PE evaluation with V:Q scan or CT after delivery. Tachycardia to the low 100s, consistent with pregnancy. O2 by nasal cannula  - Coronavirus check on admission, last negative on 8/22  Labs pending Epidural when desired Continuous fetal monitoring   1. Fetal Well being  - Fetal Tracing: 1 - Ultrasound: reviewed, as above - Group B Streptococcus: neg - Presentation: vtx confirmed by Leopolds   2. Routine OB: - Prenatal labs reviewed, as above - Rh B positive  3. Post Partum Planning: - Infant feeding: breast - Contraception: Nuvaring  4. Induction of Labor:  -  Contractions external toco in place - cervical ripening

## 2019-06-28 NOTE — Anesthesia Procedure Notes (Signed)
Epidural Patient location during procedure: OB  Staffing Performed: anesthesiologist   Preanesthetic Checklist Completed: patient identified, site marked, surgical consent, pre-op evaluation, timeout performed, IV checked, risks and benefits discussed and monitors and equipment checked  Epidural Patient position: sitting Prep: Betadine Patient monitoring: heart rate, continuous pulse ox and blood pressure Approach: midline Location: L3-L4 Injection technique: LOR saline  Needle:  Needle type: Tuohy  Needle gauge: 17 G Needle length: 9 cm and 9 Needle insertion depth: 7 cm Catheter type: closed end flexible Catheter size: 19 Gauge Catheter at skin depth: 13 cm Test dose: negative and 1.5% lidocaine with Epi 1:200 K  Assessment Sensory level: T10 Events: blood not aspirated, injection not painful, no injection resistance, negative IV test and no paresthesia  Additional Notes   Patient tolerated the insertion well without complications.-SATD -IVTD. No paresthesia. Refer to [facility] [place] for VS and dosingReason for block:procedure for pain

## 2019-06-29 ENCOUNTER — Encounter: Payer: Self-pay | Admitting: Anesthesiology

## 2019-06-29 ENCOUNTER — Encounter
Admission: EM | Disposition: A | Discharge status: Home / Self Care | Payer: Self-pay | Source: Home / Self Care | Attending: Obstetrics and Gynecology

## 2019-06-29 LAB — CBC
HCT: 33.8 % — ABNORMAL LOW (ref 36.0–46.0)
Hemoglobin: 10.8 g/dL — ABNORMAL LOW (ref 12.0–15.0)
MCH: 27.6 pg (ref 26.0–34.0)
MCHC: 32 g/dL (ref 30.0–36.0)
MCV: 86.2 fL (ref 80.0–100.0)
Platelets: 190 10*3/uL (ref 150–400)
RBC: 3.92 MIL/uL (ref 3.87–5.11)
RDW: 19.2 % — ABNORMAL HIGH (ref 11.5–15.5)
WBC: 15.6 10*3/uL — ABNORMAL HIGH (ref 4.0–10.5)
nRBC: 0.1 % (ref 0.0–0.2)

## 2019-06-29 LAB — COMPREHENSIVE METABOLIC PANEL
ALT: 26 U/L (ref 0–44)
AST: 33 U/L (ref 15–41)
Albumin: 2.5 g/dL — ABNORMAL LOW (ref 3.5–5.0)
Alkaline Phosphatase: 88 U/L (ref 38–126)
Anion gap: 11 (ref 5–15)
BUN: 19 mg/dL (ref 6–20)
CO2: 19 mmol/L — ABNORMAL LOW (ref 22–32)
Calcium: 7.9 mg/dL — ABNORMAL LOW (ref 8.9–10.3)
Chloride: 103 mmol/L (ref 98–111)
Creatinine, Ser: 1.41 mg/dL — ABNORMAL HIGH (ref 0.44–1.00)
GFR calc Af Amer: 56 mL/min — ABNORMAL LOW (ref >60)
GFR calc non Af Amer: 48 mL/min — ABNORMAL LOW (ref >60)
Glucose, Bld: 111 mg/dL — ABNORMAL HIGH (ref 70–99)
Potassium: 3.9 mmol/L (ref 3.5–5.1)
Sodium: 133 mmol/L — ABNORMAL LOW (ref 135–145)
Total Bilirubin: 0.5 mg/dL (ref 0.3–1.2)
Total Protein: 5.7 g/dL — ABNORMAL LOW (ref 6.5–8.1)

## 2019-06-29 LAB — MAGNESIUM: Magnesium: 5.6 mg/dL — ABNORMAL HIGH (ref 1.7–2.4)

## 2019-06-29 LAB — RPR: RPR Ser Ql: NONREACTIVE

## 2019-06-29 SURGERY — Surgical Case
Anesthesia: Epidural

## 2019-06-29 MED ORDER — FENTANYL 2.5 MCG/ML W/ROPIVACAINE 0.15% IN NS 100 ML EPIDURAL (ARMC)
EPIDURAL | Status: AC
  Filled 2019-06-29: qty 100

## 2019-06-29 MED ORDER — PHENYLEPHRINE 40 MCG/ML (10ML) SYRINGE FOR IV PUSH (FOR BLOOD PRESSURE SUPPORT)
PREFILLED_SYRINGE | INTRAVENOUS | Status: DC | PRN
  Administered 2019-06-29 (×2): 160 ug via INTRAVENOUS
  Administered 2019-06-29: 200 ug via INTRAVENOUS

## 2019-06-29 MED ORDER — MISOPROSTOL 200 MCG PO TABS
ORAL_TABLET | ORAL | Status: AC
  Filled 2019-06-29: qty 5

## 2019-06-29 MED ORDER — MIDAZOLAM HCL 2 MG/2ML IJ SOLN
INTRAMUSCULAR | Status: DC | PRN
  Administered 2019-06-29: 2 mg via INTRAVENOUS

## 2019-06-29 MED ORDER — ALUM & MAG HYDROXIDE-SIMETH 200-200-20 MG/5ML PO SUSP
30.0000 mL | Freq: Four times a day (QID) | ORAL | Status: DC | PRN
  Administered 2019-07-01: 04:00:00 30 mL via ORAL
  Filled 2019-06-29: qty 30

## 2019-06-29 MED ORDER — FENTANYL CITRATE (PF) 100 MCG/2ML IJ SOLN
INTRAMUSCULAR | Status: AC
  Filled 2019-06-29: qty 2

## 2019-06-29 MED ORDER — LACTATED RINGERS IV BOLUS
250.0000 mL | Freq: Once | INTRAVENOUS | Status: AC
  Administered 2019-06-29: 11:00:00 250 mL via INTRAVENOUS

## 2019-06-29 MED ORDER — ONDANSETRON HCL 4 MG/2ML IJ SOLN: 4.0000 mg | Freq: Three times a day (TID) | INTRAMUSCULAR | Status: DC | PRN

## 2019-06-29 MED ORDER — MORPHINE SULFATE (PF) 0.5 MG/ML IJ SOLN
INTRAMUSCULAR | Status: AC
  Filled 2019-06-29: qty 10

## 2019-06-29 MED ORDER — MAGNESIUM SULFATE 40 G IN LACTATED RINGERS - SIMPLE: 2.0000 g/h | INTRAVENOUS | Status: DC

## 2019-06-29 MED ORDER — SODIUM CHLORIDE (PF) 0.9 % IJ SOLN
INTRAMUSCULAR | Status: AC
  Filled 2019-06-29: qty 50

## 2019-06-29 MED ORDER — OXYCODONE HCL 5 MG PO TABS
5.0000 mg | ORAL_TABLET | ORAL | Status: DC | PRN
  Administered 2019-06-30 – 2019-07-02 (×8): 5 mg via ORAL
  Filled 2019-06-29 (×8): qty 1

## 2019-06-29 MED ORDER — MORPHINE SULFATE (PF) 2 MG/ML IV SOLN: 2.0000 mg | INTRAVENOUS | Status: DC | PRN

## 2019-06-29 MED ORDER — OXYCODONE HCL 5 MG PO TABS: 5.0000 mg | ORAL_TABLET | Freq: Once | ORAL | Status: DC | PRN

## 2019-06-29 MED ORDER — KETOROLAC TROMETHAMINE 30 MG/ML IJ SOLN
INTRAMUSCULAR | Status: AC
  Filled 2019-06-29: qty 1

## 2019-06-29 MED ORDER — MORPHINE SULFATE (PF) 0.5 MG/ML IJ SOLN
INTRAMUSCULAR | Status: DC | PRN
  Administered 2019-06-29: 3 mg via EPIDURAL

## 2019-06-29 MED ORDER — MIDAZOLAM HCL 2 MG/2ML IJ SOLN
INTRAMUSCULAR | Status: AC
  Filled 2019-06-29: qty 2

## 2019-06-29 MED ORDER — SODIUM CHLORIDE FLUSH 0.9 % IV SOLN
INTRAVENOUS | Status: AC
  Filled 2019-06-29: qty 20

## 2019-06-29 MED ORDER — FENTANYL CITRATE (PF) 100 MCG/2ML IJ SOLN: 25.0000 ug | INTRAMUSCULAR | Status: DC | PRN

## 2019-06-29 MED ORDER — CARBOPROST TROMETHAMINE 250 MCG/ML IM SOLN
INTRAMUSCULAR | Status: DC | PRN
  Administered 2019-06-29: 250 ug via INTRAMUSCULAR

## 2019-06-29 MED ORDER — FENTANYL CITRATE (PF) 100 MCG/2ML IJ SOLN
INTRAMUSCULAR | Status: DC | PRN
  Administered 2019-06-29: 100 ug via EPIDURAL

## 2019-06-29 MED ORDER — SODIUM CHLORIDE 0.9 % IV SOLN
INTRAVENOUS | Status: DC | PRN
  Administered 2019-06-29: 70 mL

## 2019-06-29 MED ORDER — BUPIVACAINE LIPOSOME 1.3 % IJ SUSP
INTRAMUSCULAR | Status: AC
  Filled 2019-06-29: qty 20

## 2019-06-29 MED ORDER — KETOROLAC TROMETHAMINE 30 MG/ML IJ SOLN
30.0000 mg | Freq: Four times a day (QID) | INTRAMUSCULAR | Status: DC
  Administered 2019-06-29: 19:00:00 30 mg via INTRAVENOUS

## 2019-06-29 MED ORDER — BUPIVACAINE LIPOSOME 1.3 % IJ SUSP: 20.0000 mL | Freq: Once | INTRAMUSCULAR | Status: DC

## 2019-06-29 MED ORDER — DOCUSATE SODIUM 100 MG PO CAPS
ORAL_CAPSULE | ORAL | Status: AC
  Filled 2019-06-29: qty 1

## 2019-06-29 MED ORDER — FUROSEMIDE 10 MG/ML IJ SOLN
20.0000 mg | Freq: Once | INTRAMUSCULAR | Status: AC
  Administered 2019-06-29: 03:00:00 20 mg via INTRAVENOUS
  Filled 2019-06-29: qty 2

## 2019-06-29 MED ORDER — GABAPENTIN 300 MG PO CAPS
300.0000 mg | ORAL_CAPSULE | Freq: Every day | ORAL | Status: DC
  Administered 2019-06-29 – 2019-07-01 (×3): 300 mg via ORAL
  Filled 2019-06-29 (×3): qty 1

## 2019-06-29 MED ORDER — DEXTROSE 5 % IV SOLN
3.0000 g | Freq: Once | INTRAVENOUS | Status: AC
  Administered 2019-06-29: 3 g via INTRAVENOUS

## 2019-06-29 MED ORDER — BUPIVACAINE-EPINEPHRINE (PF) 0.5% -1:200000 IJ SOLN
INTRAMUSCULAR | Status: AC
  Filled 2019-06-29: qty 30

## 2019-06-29 MED ORDER — METHYLERGONOVINE MALEATE 0.2 MG/ML IJ SOLN
INTRAMUSCULAR | Status: AC
  Filled 2019-06-29: qty 1

## 2019-06-29 MED ORDER — CARBOPROST TROMETHAMINE 250 MCG/ML IM SOLN
INTRAMUSCULAR | Status: AC
  Filled 2019-06-29: qty 1

## 2019-06-29 MED ORDER — DOCUSATE SODIUM 100 MG PO CAPS
100.0000 mg | ORAL_CAPSULE | Freq: Two times a day (BID) | ORAL | Status: DC
  Administered 2019-06-29 – 2019-06-30 (×3): 100 mg via ORAL
  Filled 2019-06-29 (×2): qty 1

## 2019-06-29 MED ORDER — LIDOCAINE HCL (PF) 2 % IJ SOLN
INTRAMUSCULAR | Status: DC | PRN
  Administered 2019-06-29 (×4): 100 mg via EPIDURAL

## 2019-06-29 MED ORDER — BUPIVACAINE HCL (PF) 0.5 % IJ SOLN
INTRAMUSCULAR | Status: AC
  Filled 2019-06-29: qty 30

## 2019-06-29 MED ORDER — ACETAMINOPHEN 500 MG PO TABS
1000.0000 mg | ORAL_TABLET | Freq: Four times a day (QID) | ORAL | Status: DC
  Administered 2019-06-29 – 2019-07-02 (×9): 1000 mg via ORAL
  Filled 2019-06-29 (×9): qty 2

## 2019-06-29 MED ORDER — OXYCODONE HCL 5 MG/5ML PO SOLN
5.0000 mg | Freq: Once | ORAL | Status: DC | PRN
  Filled 2019-06-29: qty 5

## 2019-06-29 MED ORDER — KETOROLAC TROMETHAMINE 30 MG/ML IJ SOLN: 30.0000 mg | Freq: Four times a day (QID) | INTRAMUSCULAR | Status: DC

## 2019-06-29 SURGICAL SUPPLY — 28 items
BARRIER ADHS 3X4 INTERCEED (GAUZE/BANDAGES/DRESSINGS) ×2 IMPLANT
CANISTER SUCT 3000ML PPV (MISCELLANEOUS) ×2 IMPLANT
CHLORAPREP W/TINT 26 (MISCELLANEOUS) ×2 IMPLANT
COVER WAND RF STERILE (DRAPES) ×2 IMPLANT
DRSG TELFA 3X8 NADH (GAUZE/BANDAGES/DRESSINGS) ×2 IMPLANT
ELECT CAUTERY BLADE 6.4 (BLADE) ×2 IMPLANT
ELECT REM PT RETURN 9FT ADLT (ELECTROSURGICAL) ×2
ELECTRODE REM PT RTRN 9FT ADLT (ELECTROSURGICAL) ×1 IMPLANT
GAUZE SPONGE 4X4 12PLY STRL (GAUZE/BANDAGES/DRESSINGS) ×2 IMPLANT
GLOVE BIO SURGEON STRL SZ8 (GLOVE) ×2 IMPLANT
GOWN STRL REUS W/ TWL LRG LVL3 (GOWN DISPOSABLE) ×2 IMPLANT
GOWN STRL REUS W/ TWL XL LVL3 (GOWN DISPOSABLE) ×1 IMPLANT
GOWN STRL REUS W/TWL LRG LVL3 (GOWN DISPOSABLE) ×2
GOWN STRL REUS W/TWL XL LVL3 (GOWN DISPOSABLE) ×1
NS IRRIG 1000ML POUR BTL (IV SOLUTION) ×2 IMPLANT
PACK C SECTION AR (MISCELLANEOUS) ×2 IMPLANT
PAD DRESSING TELFA 3X8 NADH (GAUZE/BANDAGES/DRESSINGS) ×1 IMPLANT
PAD OB MATERNITY 4.3X12.25 (PERSONAL CARE ITEMS) ×2 IMPLANT
PAD PREP 24X41 OB/GYN DISP (PERSONAL CARE ITEMS) ×2 IMPLANT
PENCIL SMOKE ULTRAEVAC 22 CON (MISCELLANEOUS) ×2 IMPLANT
RETRACTOR TRAXI PANNICULUS (MISCELLANEOUS) IMPLANT
STAPLER INSORB 30 2030 C-SECTI (MISCELLANEOUS) IMPLANT
STRAP SAFETY 5IN WIDE (MISCELLANEOUS) ×2 IMPLANT
SUCT VACUUM KIWI BELL (SUCTIONS) IMPLANT
SUT CHROMIC 1 CTX 36 (SUTURE) ×6 IMPLANT
SUT PLAIN GUT 0 (SUTURE) ×4 IMPLANT
SUT VIC AB 0 CT1 36 (SUTURE) ×4 IMPLANT
TRAXI PANNICULUS RETRACTOR (MISCELLANEOUS)

## 2019-06-29 NOTE — Discharge Summary (Signed)
Obstetrical Discharge Summary  Patient Name: Lynn Bean DOB: Mar 22, 1986 MRN: [id number]  Date of Admission: 06/28/2019 Date of Delivery:06/29/2019 Delivered by: Huel Cote MD Date of Discharge: 07/02/2019  Primary OB: [facility] OBGYNLMP:Patient's last menstrual period was 10/18/2018. EDC Estimated Date of Delivery: 07/25/19 Gestational Age at Delivery: [redacted]w[redacted]d   Antepartum complications: preeclampsia ( severe )  , SOB, oliguria   Admitting Diagnosis: preeclampsia with severe features  Secondary Diagnosis: LTCS   Patient Active Problem List   Diagnosis Date Noted  . Encounter for planned induction of labor 06/28/2019  . Preeclampsia 06/25/2019  . Gestational hypertension 06/24/2019  . Elevated blood pressure affecting pregnancy in third trimester, antepartum 06/20/2019  . Abnormal maternal serum screening test   . Family history of spina bifida   . Attention deficit hyperactivity disorder (ADHD) 06/05/2016    Augmentation: AROM, Pitocin and Foley Balloon Complications: renal oliguria , active phase arrest , CPD Date of Delivery: 06/29/2019 at [place] By: Huel Cote MD Delivery Type: primary cesarean section, low transverse incision Anesthesia: epidural, surgical dosing  Placenta: manual Laceration: none Episiotomy: none Newborn Data: Female APGARS 6/8  Postpartum Procedures: postpartum Mag until 24hrs   Cesarean Section: Pt remained on L+D with magnesium sulfate until diuresis, By time of discharge on POD#3, her pain was controlled on oral pain medications; she had appropriate lochia and was ambulating, voiding without difficulty, tolerating regular diet and passing flatus.  Her BP have remained stable in normal to mild range.  She was deemed stable for discharge to home.    Discharge Physical Exam:  BP 137/71 (BP Location: Right Arm)   Pulse 90   Temp 97.9 F (36.6 C) (Oral)   Resp 16   Ht 5\' 6"  (1.676 m)   Wt 120.3 kg   LMP 10/18/2018    SpO2 97%   Breastfeeding Unknown   BMI 42.81 kg/m   General: NAD CV: RRR Pulm: CTABL, nl effort ABD: s/nd/nt, fundus firm and below the umbilicus Lochia: small Incision: c/d/i DVT Evaluation: LE non-ttp, no evidence of DVT on exam.  Hemoglobin  Date Value Ref Range Status  07/02/2019 8.4 (L) 12.0 - 15.0 g/dL Final   HCT  Date Value Ref Range Status  07/02/2019 26.3 (L) 36.0 - 46.0 % Final     Disposition: stable, discharge to home. Baby Feeding: breastmilk Baby Disposition: home with mom  Rh Immune globulin given: n/a Rubella vaccine given: n/a Tdap vaccine given in AP or PP setting: 05/08/2019 Flu vaccine given in AP or PP setting: declined  Contraception: Nuvaring  Prenatal Labs:   Blood type/Rh B/Positive/-- (03/09 0000)  Antibody screen neg  Rubella Immune  Varicella Immune  RPR NR  HBsAg Neg  HIV NR  GC neg  Chlamydia neg  Genetic screening negative  1 hour GTT 127  3 hour GTT n/a  GBS neg     Plan:  Lynn Bean was discharged to home in good condition. Follow-up appointment with delivering provider in 2 weeks.  Discharge Medications: Allergies as of 07/02/2019      Reactions   Benadryl [diphenhydramine]    Only brand name benadryl      Medication List    STOP taking these medications   folic acid 1 MG tablet Commonly known as: FOLVITE     TAKE these medications   acetaminophen 500 MG tablet Commonly known as: TYLENOL Take 2 tablets (1,000 mg total) by mouth 4 (four) times daily.   docusate sodium 100 MG capsule Commonly  known as: COLACE Take 1 capsule (100 mg total) by mouth 2 (two) times daily.   oxyCODONE 5 MG immediate release tablet Commonly known as: Oxy IR/ROXICODONE Take 1 tablet (5 mg total) by mouth every 4 (four) hours as needed for up to 5 days for moderate pain.   polyethylene glycol 17 g packet Commonly known as: MIRALAX / GLYCOLAX Take 17 g by mouth daily as needed for mild constipation.   prenatal  multivitamin Tabs tablet Take 1 tablet by mouth daily at 12 noon.   senna-docusate 8.6-50 MG tablet Commonly known as: Senokot-S Take 2 tablets by mouth daily. Start taking on: July 03, 2019   simethicone 80 MG chewable tablet Commonly known as: MYLICON Chew 1 tablet (80 mg total) by mouth 3 (three) times daily after meals.       Follow-up Information    Schermerhorn, Gwen Her, MD Follow up in 2 week(s).   Specialty: Obstetrics and Gynecology Why: Video visit or in person at [facility] information: [address] [place] [address] [phone]           Signed: Francetta Found, CNM 07/02/2019  11:01 AM

## 2019-06-29 NOTE — Progress Notes (Signed)
Lynn Bean is a 33 y.o. G1P0000 at 93w2dadmitted 2 days ago for presumed preeclampsia with severe features . Currently on magnesium at 1gm / hr and Pitocin at 14 mu/ min . Lasix given yesterday and this am at0256 .for some rales noted on exam .  Subjective: [name] in place and she is comfortable .Marland Kitchen She denies h/a and vision changes 1200 cc urine out in last shift   Objective: BP (!) 142/82   Pulse 93   Temp 98.1 F (36.7 C) (Oral)   Resp 18   Ht 5\' 6"  (1.676 m)   Wt 113.4 kg   LMP 10/18/2018   SpO2 97%   BMI 40.35 kg/m  I/O last 3 completed shifts: In: 4042.2 [P.O.:1860; I.V.:2182.2] Out: 3515 [Urine:3515] Total I/O In: 38.5 [I.V.:38.5] Out: 5 [Urine:5] 1200 cc in last shift  FHT:  FHR: 140 bpm, variability: minimal ,  accelerations:  Present,  decelerations:  Absent UC:   regular, every 3 minutes, MVU 180  SVE:   Dilation: 4 Effacement (%): 70 Station: -2 Exam by:: RS cx exam at 0815 by TJS : 4 cm / 90 / 0 station  VTX , narrow arch  Labs: Lab Results  Component Value Date   WBC 15.6 (H) 06/28/2019   HGB 10.6 (L) 06/28/2019   HCT 33.0 (L) 06/28/2019   MCV 85.5 06/28/2019   PLT 180 06/28/2019    Assessment / Plan: preeclampsia , stable . Normal urine output and exam is normal except for excessive edema . Decreased DTR .  Protracted early labor  Increase Pitocin to get adequate MVU , If no cervix change I have talked to her about cesarean section  Repeat  labs   Lynn Bean 06/29/2019, 8:23 AM

## 2019-06-29 NOTE — Progress Notes (Signed)
Lynn Bean is a 33 y.o. G1P0000  Subjective: comfortable  Pt has received 250 cc x 2 boluses . 20 cc UO  after first bolus  And now 30 minutes after second bolus 20 cc Objective: BP (!) 152/81   Pulse 89   Temp 98.1 F (36.7 C) (Oral)   Resp 18   Ht 5\' 6"  (1.676 m)   Wt 113.4 kg   LMP 10/18/2018   SpO2 100%   BMI 40.35 kg/m  I/O last 3 completed shifts: In: 4042.2 [P.O.:1860; I.V.:2182.2] Out: [id number] [Urine:3515] Total I/O In: 2023.5 [P.O.:970; I.V.:767; Other:60; IV Piggyback:226.5] Out: 11 [Urine:70]  FHT:  FHR: 140 bpm, variability: minimal ,  accelerations:  Present,  decelerations:  Absent UC:   regular, every 3 minutes cx 5 cm / c / 0  Labs: Lab Results  Component Value Date   WBC 15.6 (H) 06/29/2019   HGB 10.8 (L) 06/29/2019   HCT 33.8 (L) 06/29/2019   MCV 86.2 06/29/2019   PLT 190 06/29/2019    Assessment / Plan: renal function decreased  but improving after second bolus . Marland KitchenCervical change by my exam   Increase totoal hourly fluid to 125 / hr  Cont pitocin   monitor hourly urine output  [name] 06/29/2019, 1:16 PM

## 2019-06-29 NOTE — Transfer of Care (Signed)
Immediate Anesthesia Transfer of Care Note  Patient: Lynn Bean  Procedure(s) Performed: CESAREAN SECTION (N/A )  Patient Location: PACU and Mother/Baby  Anesthesia Type:Epidural  Level of Consciousness: awake, alert , oriented and patient cooperative  Airway & Oxygen Therapy: Patient Spontanous Breathing  Post-op Assessment: Report given to RN and Post -op Vital signs reviewed and stable  Post vital signs: Reviewed and stable  Last Vitals:  Vitals Value Taken Time  BP    Temp    Pulse 91 06/29/19 1835  Resp 23 06/29/19 1835  SpO2 96 % 06/29/19 1835  Vitals shown include unvalidated device data.  Last Pain:  Vitals:   06/29/19 1330  TempSrc:   PainSc: 0-No pain      Patients Stated Pain Goal: 0 ([id number])  Complications: No apparent anesthesia complications

## 2019-06-29 NOTE — Brief Op Note (Signed)
06/29/2019  6:42 PM  PATIENT:  Lynn Bean  33 y.o. female  PRE-OPERATIVE DIAGNOSIS:  Preeclampsia with severe features                                                       Oliguria                                                        Active phase arrest                                                        36+2 weeks  POST-OPERATIVE DIAGNOSIS: Preeclampsia with severe features                                                       Oliguria                                                        CPD  PROCEDURE:  Procedure(s): CESAREAN SECTION (N/A) LTCS SURGEON:  Surgeon(s) and Role:    * Gelene Recktenwald, Gwen Her, MD - Primary  PHYSICIAN ASSISTANT: R Mcvey , CNM   ASSISTANTS: none   ANESTHESIA:   epidural, surgical dosing   EBL:  700 mL   BLOOD ADMINISTERED:none  DRAINS: Urinary Catheter (Foley)   LOCAL MEDICATIONS USED:  MARCAINE    and BUPIVICAINE   SPECIMEN:  No Specimen  DISPOSITION OF SPECIMEN:  N/A  COUNTS:  YES  TOURNIQUET:  * No tourniquets in log *  DICTATION: .Other Dictation: Dictation Number verbal  PLAN OF CARE: Admit to inpatient   PATIENT DISPOSITION:  PACU - hemodynamically stable.   Delay start of Pharmacological VTE agent (>24hrs) due to surgical blood loss or risk of bleeding: not applicable

## 2019-06-29 NOTE — Progress Notes (Signed)
Lynn Bean is a 33 y.o. G1P0000 at [redacted]w[redacted]d  Subjective: Pt with no urine output in 2.5 hours despite increasing hourly fluids to 125 cc/ hr .  cx recheck by me still 5 cm and now with some caput and cervix is swelling .    Objective: BP (!) 145/89   Pulse 81   Temp 98.1 F (36.7 C) (Oral)   Resp 18   Ht 5\' 6"  (1.676 m)   Wt 113.4 kg   LMP 10/18/2018   SpO2 99%   BMI 40.35 kg/m  I/O last 3 completed shifts: In: 4042.2 [P.O.:1860; I.V.:2182.2] Out: [id number] [Urine:3515] Total I/O In: 2659.5 [P.O.:1215; I.V.:1121.9; Other:96; IV Piggyback:226.5] Out: 90 [Urine:90]  FHT:  FHR: 130 bpm, variability: minimal ,  accelerations:  Present,  decelerations:  Absent UC:   regular, every 3 minutes SVE:   Dilation: 5 Effacement (%): 100 Station: 0 Exam by:: Nahzir Pohle MD  Labs: Lab Results  Component Value Date   WBC 15.6 (H) 06/29/2019   HGB 10.8 (L) 06/29/2019   HCT 33.8 (L) 06/29/2019   MCV 86.2 06/29/2019   PLT 190 06/29/2019    Assessment / Plan: Worsening renal function  With no urine output despite 2 fluid challenges and increasing hourly IVF .  Arrested cervical dilation ., now with caput and swelling of cervix .  I have explained my concerns of worsening renal function and recommend a LTCS  I have explained the possibility of ling term renal injury if we do not try to reverse her preeclampisa with delivery . Pt has agreed to the procedure . Risks have been discussed  Gwen Her Saadia Dewitt 06/29/2019, 4:14 PM

## 2019-06-29 NOTE — Anesthesia Post-op Follow-up Note (Signed)
Anesthesia QCDR form completed.        

## 2019-06-29 NOTE — Progress Notes (Signed)
Patient ID: Lynn Bean, female   DOB: Jan 13, 1986, 33 y.o.   MRN: [id number] Lungs CTA  , no rales , CV RRR  DTR 0+/ 4 cx exam 4 cm / 90 / 0 vtx

## 2019-06-29 NOTE — Op Note (Signed)
NAME: Lynn Bean, Lynn Bean MEDICAL RECORD [id number] ACCOUNT [id number] DATE OF BIRTH:07-Dec-1985 FACILITY: [facility] LOCATION: [facility] PHYSICIAN:Jarman Litton Cloyde Reams, MD  OPERATIVE REPORT  DATE OF PROCEDURE:  06/29/2019  PREOPERATIVE DIAGNOSES: 1.  Preeclampsia with severe features. 2.  Oliguria. 3.  Active phase arrest. 4.  At 36+2 weeks estimated gestational age.  POSTOPERATIVE DIAGNOSES: 1.  Severe preeclampsia. 2.  Oliguria. 3.  Cephalopelvic disproportion. 4.  Vigorous viable female delivered.  PROCEDURE:  Primary low transverse cesarean section.  ANESTHESIA:  Surgical dosing of continuous lumbar epidural.  SURGEON:  Jennell Corner, MD.  FIRST ASSISTANT:  Heloise Ochoa, certified nurse midwife.  INDICATIONS:  A 33 year old gravida 1, para 0 at 36+2 weeks estimated gestational age.  The patient was admitted to labor and delivery the day prior with elevated blood pressures and shortness of breath.  The patient was diagnosed with preeclampsia with  severe features.  Induction was started.  The patient was started on magnesium sulfate for seizure prophylaxis.  The patient labored throughout the day and had decreased urine output for the last 3 hours of her labor with no urine output noted despite 2  fluid challenges and increasing IV fluids.  Cervix did not progress past 5 cm and there was cervical edema and caput that was appreciated on cervical exam.  DESCRIPTION OF PROCEDURE:  After adequate surgical dosing of continuous lumbar epidural, the patient was placed in dorsal supine position, hip roll on the right side.  The patient's abdomen was prepped and draped in normal sterile fashion.  The patient  did receive 3 grams of IV Ancef for surgical prophylaxis.  Timeout was performed.  A Pfannenstiel incision was made 2 fingerbreadths above the symphysis pubis.  Sharp dissection was used to identify the fascia.  Fascia was opened in the midline and  opened in a transverse  fashion.  Superior aspect of the fascia was grasped with Kocher clamps and the recti muscles were dissected free.  Inferior aspect of the fascia was grasped with Kocher clamps and pyramidalis muscle was dissected free.  Entry into  the peritoneal cavity was accomplished sharply.  The vesicouterine peritoneal fold was identified and a bladder flap was created and the bladder was reflected inferiorly.  Low transverse uterine incision was made.  Upon entry into the endometrial cavity,  clear fluid resulted.  An extremely wedged asynclitic head was noted.  The nurse was asked to aid with a vaginal hand for elevation of the fetal head.  Fetal head was then brought to the incision and a loose nuchal cord was reduced, and the shoulders  and the body of this female was delivered.  Infant was slightly floppy initially, but responded to stimulation.  After 30 seconds of delayed cord clamping, the pediatrician staff asked for expedited cord clamping.  The female was passed to nursery staff  who assigned Apgar scores of 6 and 8.  Time of delivery was 1733 again on 06/29/2019.  Placenta was manually delivered and the uterus was exteriorized.  Intravenous Pitocin was administered while the endometrial cavity was wiped clean with laparotomy  tape.  Slight uterine atony was noted and Hemabate was requested 250 mcg intramuscular.  Tone improved.  The uterine incision was closed with a running #1 chromic suture.  Good hemostasis was noted.  One additional figure-of-eight suture was required.   The fallopian tubes and ovaries appeared normal.  The posterior cul-de-sac was irrigated and suctioned and the uterus was placed back in the abdominal cavity.  The pericolic gutters were  wiped clean with laparotomy tape and the uterine incision again  appeared hemostatic.  Interceed was placed over the uterine incision in a T-shaped fashion.  The fascia was then closed with 0 Vicryl suture in a running nonlocking fashion.  Good  approximation of edges.  Subcutaneous tissues were irrigated and bovied.   The fascial edges were then injected with a solution of 20 mL of 1.3% Exparel plus 30 mL of 0.5% Marcaine plus 50 mL.  Approximately 60 mL of this solution was injected in the fascial edges.  Given the depth of the subcutaneous tissue approximately 3.5  cm, the subcutaneous dead space was closed with a running 2-0 chromic suture.  The skin was reapproximated with Insorb absorbable staples.  Good cosmetic effect.  Good hemostasis was noted.  Additional 30 mL of Exparel solution was injected beneath the  skin.  COMPLICATIONS:  There were no complications.  ESTIMATED BLOOD LOSS:  700 mL.  INTRAOPERATIVE FLUIDS:  1000 mL.  URINE OUTPUT:  75 mL.  DISPOSITION:  The patient tolerated the procedure well and was taken to recovery room in good condition.  TN/NUANCE  D:06/29/2019 T:06/29/2019 JOB:[id number]

## 2019-06-29 NOTE — Progress Notes (Signed)
Patient ID: Lynn Bean, female   DOB: May 17, 1986, 33 y.o.   MRN: [id number] IUPC replaced  Now on 24 mu/min pitocin .  Pt with poor urine output . Will do a fluid challenge 250 cc. LR   cx 4cm / c/ 0  Results for orders placed or performed during the hospital encounter of 06/28/19 (from the past 24 hour(s))  CBC     Status: Abnormal   Collection Time: 06/28/19  9:00 PM  Result Value Ref Range   WBC 15.6 (H) 4.0 - 10.5 K/uL   RBC 3.86 (L) 3.87 - 5.11 MIL/uL   Hemoglobin 10.6 (L) 12.0 - 15.0 g/dL   HCT 33.0 (L) 36.0 - 46.0 %   MCV 85.5 80.0 - 100.0 fL   MCH 27.5 26.0 - 34.0 pg   MCHC 32.1 30.0 - 36.0 g/dL   RDW 19.3 (H) 11.5 - 15.5 %   Platelets 180 150 - 400 K/uL   nRBC 0.3 (H) 0.0 - 0.2 %  Magnesium     Status: Abnormal   Collection Time: 06/29/19 10:06 AM  Result Value Ref Range   Magnesium 5.6 (H) 1.7 - 2.4 mg/dL  Comprehensive metabolic panel     Status: Abnormal   Collection Time: 06/29/19 10:06 AM  Result Value Ref Range   Sodium 133 (L) 135 - 145 mmol/L   Potassium 3.9 3.5 - 5.1 mmol/L   Chloride 103 98 - 111 mmol/L   CO2 19 (L) 22 - 32 mmol/L   Glucose, Bld 111 (H) 70 - 99 mg/dL   BUN 19 6 - 20 mg/dL   Creatinine, Ser 1.41 (H) 0.44 - 1.00 mg/dL   Calcium 7.9 (L) 8.9 - 10.3 mg/dL   Total Protein 5.7 (L) 6.5 - 8.1 g/dL   Albumin 2.5 (L) 3.5 - 5.0 g/dL   AST 33 15 - 41 U/L   ALT 26 0 - 44 U/L   Alkaline Phosphatase 88 38 - 126 U/L   Total Bilirubin 0.5 0.3 - 1.2 mg/dL   GFR calc non Af Amer 48 (L) >60 mL/min   GFR calc Af Amer 56 (L) >60 mL/min   Anion gap 11 5 - 15  CBC     Status: Abnormal   Collection Time: 06/29/19 10:06 AM  Result Value Ref Range   WBC 15.6 (H) 4.0 - 10.5 K/uL   RBC 3.92 3.87 - 5.11 MIL/uL   Hemoglobin 10.8 (L) 12.0 - 15.0 g/dL   HCT 33.8 (L) 36.0 - 46.0 %   MCV 86.2 80.0 - 100.0 fL   MCH 27.6 26.0 - 34.0 pg   MCHC 32.0 30.0 - 36.0 g/dL   RDW 19.2 (H) 11.5 - 15.5 %   Platelets 190 150 - 400 K/uL   nRBC 0.1 0.0 - 0.2 %   concerning  increase in Creatinine. A; decreased urine output with increasing creatinine - worsening vasospasm  P; 250 cc fluid challenge  If no response I will talk to couple about c/s

## 2019-06-30 ENCOUNTER — Encounter: Payer: Self-pay | Admitting: Obstetrics and Gynecology

## 2019-06-30 LAB — CBC
HCT: 32.1 % — ABNORMAL LOW (ref 36.0–46.0)
Hemoglobin: 10.5 g/dL — ABNORMAL LOW (ref 12.0–15.0)
MCH: 27.8 pg (ref 26.0–34.0)
MCHC: 32.7 g/dL (ref 30.0–36.0)
MCV: 84.9 fL (ref 80.0–100.0)
Platelets: 183 10*3/uL (ref 150–400)
RBC: 3.78 MIL/uL — ABNORMAL LOW (ref 3.87–5.11)
RDW: 18.8 % — ABNORMAL HIGH (ref 11.5–15.5)
WBC: 25.3 10*3/uL — ABNORMAL HIGH (ref 4.0–10.5)
nRBC: 0 % (ref 0.0–0.2)

## 2019-06-30 LAB — COMPREHENSIVE METABOLIC PANEL
ALT: 17 U/L (ref 0–44)
ALT: 15 U/L (ref 0–44)
AST: 27 U/L (ref 15–41)
AST: 22 U/L (ref 15–41)
Albumin: 2 g/dL — ABNORMAL LOW (ref 3.5–5.0)
Albumin: 2 g/dL — ABNORMAL LOW (ref 3.5–5.0)
Alkaline Phosphatase: 93 U/L (ref 38–126)
Alkaline Phosphatase: 93 U/L (ref 38–126)
Anion gap: 7 (ref 5–15)
Anion gap: 10 (ref 5–15)
BUN: 22 mg/dL — ABNORMAL HIGH (ref 6–20)
BUN: 22 mg/dL — ABNORMAL HIGH (ref 6–20)
CO2: 20 mmol/L — ABNORMAL LOW (ref 22–32)
CO2: 19 mmol/L — ABNORMAL LOW (ref 22–32)
Calcium: 7.5 mg/dL — ABNORMAL LOW (ref 8.9–10.3)
Calcium: 7.2 mg/dL — ABNORMAL LOW (ref 8.9–10.3)
Chloride: 104 mmol/L (ref 98–111)
Chloride: 103 mmol/L (ref 98–111)
Creatinine, Ser: 1.85 mg/dL — ABNORMAL HIGH (ref 0.44–1.00)
Creatinine, Ser: 1.74 mg/dL — ABNORMAL HIGH (ref 0.44–1.00)
GFR calc Af Amer: 40 mL/min — ABNORMAL LOW (ref >60)
GFR calc Af Amer: 44 mL/min — ABNORMAL LOW (ref >60)
GFR calc non Af Amer: 35 mL/min — ABNORMAL LOW (ref >60)
GFR calc non Af Amer: 38 mL/min — ABNORMAL LOW (ref >60)
Glucose, Bld: 90 mg/dL (ref 70–99)
Glucose, Bld: 117 mg/dL — ABNORMAL HIGH (ref 70–99)
Potassium: 5 mmol/L (ref 3.5–5.1)
Potassium: 4.7 mmol/L (ref 3.5–5.1)
Sodium: 131 mmol/L — ABNORMAL LOW (ref 135–145)
Sodium: 132 mmol/L — ABNORMAL LOW (ref 135–145)
Total Bilirubin: 0.4 mg/dL (ref 0.3–1.2)
Total Bilirubin: 0.2 mg/dL — ABNORMAL LOW (ref 0.3–1.2)
Total Protein: 4.8 g/dL — ABNORMAL LOW (ref 6.5–8.1)
Total Protein: 4.8 g/dL — ABNORMAL LOW (ref 6.5–8.1)

## 2019-06-30 LAB — CBC WITH DIFFERENTIAL/PLATELET
Abs Immature Granulocytes: 0.19 10*3/uL — ABNORMAL HIGH (ref 0.00–0.07)
Basophils Absolute: 0 10*3/uL (ref 0.0–0.1)
Basophils Relative: 0 %
Eosinophils Absolute: 0.3 10*3/uL (ref 0.0–0.5)
Eosinophils Relative: 1 %
HCT: 32.9 % — ABNORMAL LOW (ref 36.0–46.0)
Hemoglobin: 10.6 g/dL — ABNORMAL LOW (ref 12.0–15.0)
Immature Granulocytes: 1 %
Lymphocytes Relative: 6 %
Lymphs Abs: 1.5 10*3/uL (ref 0.7–4.0)
MCH: 27.2 pg (ref 26.0–34.0)
MCHC: 32.2 g/dL (ref 30.0–36.0)
MCV: 84.6 fL (ref 80.0–100.0)
Monocytes Absolute: 1.2 10*3/uL — ABNORMAL HIGH (ref 0.1–1.0)
Monocytes Relative: 5 %
Neutro Abs: 20.8 10*3/uL — ABNORMAL HIGH (ref 1.7–7.7)
Neutrophils Relative %: 87 %
Platelets: 195 10*3/uL (ref 150–400)
RBC: 3.89 MIL/uL (ref 3.87–5.11)
RDW: 18.9 % — ABNORMAL HIGH (ref 11.5–15.5)
WBC: 24 10*3/uL — ABNORMAL HIGH (ref 4.0–10.5)
nRBC: 0 % (ref 0.0–0.2)

## 2019-06-30 LAB — MAGNESIUM: Magnesium: 8.6 mg/dL (ref 1.7–2.4)

## 2019-06-30 MED ORDER — SODIUM CHLORIDE FLUSH 0.9 % IV SOLN
INTRAVENOUS | Status: AC
  Filled 2019-06-30: qty 10

## 2019-06-30 MED ORDER — KETOROLAC TROMETHAMINE 30 MG/ML IJ SOLN
30.0000 mg | Freq: Three times a day (TID) | INTRAMUSCULAR | Status: DC | PRN
  Administered 2019-06-30: 07:00:00 30 mg via INTRAVENOUS
  Filled 2019-06-30: qty 1

## 2019-06-30 MED ORDER — LACTATED RINGERS IV SOLN
INTRAVENOUS | Status: DC
  Administered 2019-06-30: 08:00:00 via INTRAVENOUS

## 2019-06-30 NOTE — Anesthesia Post-op Follow-up Note (Signed)
  Anesthesia Pain Follow-up Note  Patient: Lynn Bean  Day #: 1  Date of Follow-up: 06/30/2019 Time: 7:23 AM  Last Vitals:  Vitals:   06/30/19 0638 06/30/19 0640  BP: (!) 172/85 (!) 148/78  Pulse: 81 83  Resp:    Temp:    SpO2:  99%    Level of Consciousness: alert  Pain: none   Side Effects:None  Catheter Site Exam:clean, dry, no drainage     Plan: D/C from anesthesia care at surgeon's request  Hedda Slade

## 2019-06-30 NOTE — Anesthesia Postprocedure Evaluation (Signed)
Anesthesia Post Note  Patient: Lynn Bean  Procedure(s) Performed: CESAREAN SECTION (N/A )  Patient location during evaluation: Mother Baby Anesthesia Type: Epidural Level of consciousness: awake and alert Pain management: pain level controlled Vital Signs Assessment: post-procedure vital signs reviewed and stable Respiratory status: spontaneous breathing, nonlabored ventilation and respiratory function stable Cardiovascular status: stable Postop Assessment: no headache, no backache and epidural receding Anesthetic complications: no     Last Vitals:  Vitals:   06/30/19 0638 06/30/19 0640  BP: (!) 172/85 (!) 148/78  Pulse: 81 83  Resp:    Temp:    SpO2:  99%    Last Pain:  Vitals:   06/30/19 0435  TempSrc: Oral  PainSc:                  Hedda Slade

## 2019-06-30 NOTE — Progress Notes (Signed)
Post Partum Day 1 Subjective:  remains on bedrest, foley cath in place draining clear yellow urine.   No CP SOB Fever,Chills, N/V or leg pain; denies nipple or breast pain no HA change of vision, RUQ/epigastric pain  Objective: BP 132/70   Pulse 78   Temp 98 F (36.7 C) (Oral)   Resp 18   Ht 5\' 6"  (1.676 m)   Wt 113.4 kg   LMP 10/18/2018   SpO2 97%   Breastfeeding Unknown   BMI 40.35 kg/m    Physical Exam:  General: NAD, generalized edema Breasts: soft/nontender CV: RRR Pulm: nl effort, CTABL Abdomen: soft, NT, BS x 4 Incision: Dsg CDI  Lochia: small Uterine Fundus: fundus firm and 1 fb below umbilicus DVT Evaluation: no cords, ttp LEs, 2+ DTRs  Recent Labs    06/29/19 1006 06/30/19 0706  HGB 10.8* 10.5*  HCT 33.8* 32.1*  WBC 15.6* 25.3*  PLT 190 183   Mag running at 2grams/hr Total fluids at 144ml/hr at 0730, decreased to 64ml/hr total at 0730 by Dr ward.  Total I/O In: 200 [P.O.:200] Out: 750 [Urine:750]  - minimal PO intake - hourly urine since 0730: 150, 350, 150   Assessment/Plan: 33 y.o. G1P0101 postpartum day # 1 with severe Pre-e at [redacted]w[redacted]d, now approx 16hrs post delivery.   - d/w Dr Leonides Schanz, Creatinine elevated 1.85, inc from 1.41 last PM. Urine output borderline over last few hours with elevated Mag level 8.6, decreased Magnesium to 1 gram/hr; total IV fluids currently at 62.74ml/hr; Pitocin stopped at 0730.  - encouraged turn cough and deep breathe hourly.  - maintain SCDs    Disposition: remain on Magnesium until 24hrs PP    Francetta Found, CNM 06/30/2019  9:42 AM

## 2019-06-30 NOTE — Lactation Note (Signed)
This note was copied from a baby's chart. Lactation Consultation Note  Patient Name: Lynn Bean [id number] Date: 06/30/2019   Mom still in [place] being observed on Magnesium Sulfate.  When went to check on how breast feeding was going, FOB voiced that now was a good time to help because she was stating to stir a little.  Mom reports [name] had recently breast fed and had been breast feeding great and did not need any assistance.  Mom was holding and Adalynn was sleeping.  Void was changed and put Adalynn back with mom.  Mom attempted to breast feed, but Adalynn was too sleepy.  Discussed affordable health care act and how to get DEBP free through [employer].  Mom reports already bought [name] pump which was better than any pump they could get through insurance.  Explained feeding cues and encouraged to put her to the breast whenever she demonstrated hunger cues.  Reviewed newborn stomach size, supply and demand, normal course of lactation and routine newborn feeding cues.  Encouraged mom to ask for lactation assistance as needed.   Maternal Data    Feeding Feeding Type: Breast Fed  LATCH Score                   Interventions    Lactation Tools Discussed/Used     Consult Status      Jarold Motto 06/30/2019, 6:58 PM

## 2019-06-30 NOTE — Progress Notes (Signed)
Post Partum Day 1  Subjective: Having some pain with movement while in bed, nervous about getting up to void.   No CP SOB Fever,Chills, N/V or leg pain; denies nipple or breast pain no HA change of vision, RUQ/epigastric pain  Objective: BP (!) 144/67   Pulse 89   Temp 98.7 F (37.1 C) (Oral)   Resp 18   Ht 5\' 6"  (1.676 m)   Wt 113.4 kg   LMP 10/18/2018   SpO2 96%   Breastfeeding Unknown   BMI 40.35 kg/m    Physical Exam:  General: NAD, generalized edema Breasts: soft/nontender CV: RRR Pulm: nl effort, CTABL Abdomen: soft, NT, BS x 4 Incision: Dsg CDI  Lochia: small Uterine Fundus: fundus firm and 1 fb below umbilicus DVT Evaluation: no cords, ttp LEs, 2+ DTRs; 4+ pedal edema, 2+ edema to knees, SCDs remain in place  Recent Labs    06/30/19 0706 06/30/19 1907  HGB 10.5* 10.6*  HCT 32.1* 32.9*  WBC 25.3* 24.0*  PLT 183 195   CMP 8/28 at 1907 Creatinine: 1.74 H Sodium: 132 L Potassium 4.7 BUN 22 H GFR 38 L Normal AST/ALT    Total fluids at 58ml/hr to keep IV patent  Total I/O In: 101.7 [I.V.:101.7] Out: 300 [Urine:300]   Urine output 150-315ml per hour since 0800    Assessment/Plan: 33 y.o. G1P0101 postpartum day # 1 with severe Pre-e at [redacted]w[redacted]d  - Per plan of care with Dr Leonides Schanz, Magnesium off and Foley cath DC. - BP stable, mild range to normal.   - Pt to remain in [facility] for close observation, strict I/Os - plan repeat Labs in Am - encouraged IS use every hour, TCDB hourly - pain mgmt as ordered, reviewed using splint against incision for movement. Up to [facility] with assist until stable with position changes.  - lactation prn  Disposition: monitor closely with strict I/Os    Francetta Found, CNM 06/30/2019  10:01 PM

## 2019-07-01 LAB — CBC WITH DIFFERENTIAL/PLATELET
Abs Immature Granulocytes: 0.19 10*3/uL — ABNORMAL HIGH (ref 0.00–0.07)
Basophils Absolute: 0 10*3/uL (ref 0.0–0.1)
Basophils Relative: 0 %
Eosinophils Absolute: 0.4 10*3/uL (ref 0.0–0.5)
Eosinophils Relative: 2 %
HCT: 32.6 % — ABNORMAL LOW (ref 36.0–46.0)
Hemoglobin: 10.4 g/dL — ABNORMAL LOW (ref 12.0–15.0)
Immature Granulocytes: 1 %
Lymphocytes Relative: 9 %
Lymphs Abs: 1.7 10*3/uL (ref 0.7–4.0)
MCH: 27.5 pg (ref 26.0–34.0)
MCHC: 31.9 g/dL (ref 30.0–36.0)
MCV: 86.2 fL (ref 80.0–100.0)
Monocytes Absolute: 1.1 10*3/uL — ABNORMAL HIGH (ref 0.1–1.0)
Monocytes Relative: 6 %
Neutro Abs: 15.4 10*3/uL — ABNORMAL HIGH (ref 1.7–7.7)
Neutrophils Relative %: 82 %
Platelets: 213 10*3/uL (ref 150–400)
RBC: 3.78 MIL/uL — ABNORMAL LOW (ref 3.87–5.11)
RDW: 18.7 % — ABNORMAL HIGH (ref 11.5–15.5)
WBC: 18.8 10*3/uL — ABNORMAL HIGH (ref 4.0–10.5)
nRBC: 0 % (ref 0.0–0.2)

## 2019-07-01 LAB — COMPREHENSIVE METABOLIC PANEL
ALT: 14 U/L (ref 0–44)
AST: 18 U/L (ref 15–41)
Albumin: 2 g/dL — ABNORMAL LOW (ref 3.5–5.0)
Alkaline Phosphatase: 95 U/L (ref 38–126)
Anion gap: 8 (ref 5–15)
BUN: 23 mg/dL — ABNORMAL HIGH (ref 6–20)
CO2: 22 mmol/L (ref 22–32)
Calcium: 7.5 mg/dL — ABNORMAL LOW (ref 8.9–10.3)
Chloride: 106 mmol/L (ref 98–111)
Creatinine, Ser: 1.6 mg/dL — ABNORMAL HIGH (ref 0.44–1.00)
GFR calc Af Amer: 48 mL/min — ABNORMAL LOW (ref >60)
GFR calc non Af Amer: 42 mL/min — ABNORMAL LOW (ref >60)
Glucose, Bld: 93 mg/dL (ref 70–99)
Potassium: 4.3 mmol/L (ref 3.5–5.1)
Sodium: 136 mmol/L (ref 135–145)
Total Bilirubin: 0.4 mg/dL (ref 0.3–1.2)
Total Protein: 5 g/dL — ABNORMAL LOW (ref 6.5–8.1)

## 2019-07-01 MED ORDER — SENNOSIDES-DOCUSATE SODIUM 8.6-50 MG PO TABS
2.0000 | ORAL_TABLET | ORAL | Status: DC
  Administered 2019-07-01: 2 via ORAL
  Filled 2019-07-01: qty 2

## 2019-07-01 MED ORDER — WITCH HAZEL-GLYCERIN EX PADS: 1.0000 "application " | MEDICATED_PAD | CUTANEOUS | Status: DC | PRN

## 2019-07-01 MED ORDER — DIBUCAINE (PERIANAL) 1 % EX OINT: 1.0000 "application " | TOPICAL_OINTMENT | CUTANEOUS | Status: DC | PRN

## 2019-07-01 MED ORDER — DOCUSATE SODIUM 100 MG PO CAPS
100.0000 mg | ORAL_CAPSULE | Freq: Two times a day (BID) | ORAL | Status: DC
  Administered 2019-07-01 – 2019-07-02 (×2): 100 mg via ORAL
  Filled 2019-07-01 (×2): qty 1

## 2019-07-01 MED ORDER — ZOLPIDEM TARTRATE 5 MG PO TABS: 5.0000 mg | ORAL_TABLET | Freq: Every evening | ORAL | Status: DC | PRN

## 2019-07-01 MED ORDER — MENTHOL 3 MG MT LOZG
1.0000 | LOZENGE | OROMUCOSAL | Status: DC | PRN
  Filled 2019-07-01: qty 9

## 2019-07-01 MED ORDER — SIMETHICONE 80 MG PO CHEW
80.0000 mg | CHEWABLE_TABLET | Freq: Three times a day (TID) | ORAL | Status: DC
  Administered 2019-07-01 – 2019-07-02 (×3): 80 mg via ORAL
  Filled 2019-07-01 (×3): qty 1

## 2019-07-01 MED ORDER — POLYETHYLENE GLYCOL 3350 17 G PO PACK
17.0000 g | PACK | Freq: Every day | ORAL | Status: DC | PRN
  Administered 2019-07-01 – 2019-07-02 (×2): 17 g via ORAL
  Filled 2019-07-01 (×3): qty 1

## 2019-07-01 MED ORDER — PRENATAL MULTIVITAMIN CH
1.0000 | ORAL_TABLET | Freq: Every day | ORAL | Status: DC
  Administered 2019-07-02: 10:00:00 1 via ORAL
  Filled 2019-07-01: qty 1

## 2019-07-01 MED ORDER — SIMETHICONE 80 MG PO CHEW: 80.0000 mg | CHEWABLE_TABLET | ORAL | Status: DC | PRN

## 2019-07-01 MED ORDER — SIMETHICONE 80 MG PO CHEW
80.0000 mg | CHEWABLE_TABLET | ORAL | Status: DC
  Administered 2019-07-01: 80 mg via ORAL
  Filled 2019-07-01: qty 1

## 2019-07-01 MED ORDER — COCONUT OIL OIL
1.0000 "application " | TOPICAL_OIL | Status: DC | PRN
  Administered 2019-07-01: 1 via TOPICAL
  Filled 2019-07-01: qty 120

## 2019-07-01 MED ORDER — TETANUS-DIPHTH-ACELL PERTUSSIS 5-2.5-18.5 LF-MCG/0.5 IM SUSP
0.5000 mL | Freq: Once | INTRAMUSCULAR | Status: DC
  Filled 2019-07-01: qty 0.5

## 2019-07-01 NOTE — Progress Notes (Signed)
Post Partum Day 2  Subjective: Has been OOB x 3 with assist,  Able to ambulate with some assist.  No CP SOB Fever,Chills, N/V or leg pain; denies nipple or breast pain no HA change of vision, RUQ/epigastric pain  Objective: BP 140/76   Pulse 86   Temp 98.9 F (37.2 C) (Oral)   Resp 20   Ht 5\' 6"  (1.676 m)   Wt 120.3 kg   LMP 10/18/2018   SpO2 96%   Breastfeeding Unknown   BMI 42.81 kg/m    Physical Exam:  General: NAD, generalized edema Breasts: soft/nontender CV: RRR Pulm: nl effort, CTABL Abdomen: soft, NT, BS x 4- passing flatus.  Incision: Dsg CDI  Lochia: small Uterine Fundus: fundus firm and 1 fb below umbilicus DVT Evaluation: no cords, ttp LEs, 2+ DTRs; 4+ pedal edema, 2+ edema to knees, SCDs remain in place  Recent Labs    06/30/19 1907 07/01/19 0712  HGB 10.6* 10.4*  HCT 32.9* 32.6*  WBC 24.0* 18.8*  PLT 195 213   CMP 8/29 at 0712 Creatinine: 1.60 H Sodium: 136 L Potassium 4.3 BUN 23 H GFR 42 L Normal AST/ALT  Total fluids at 34ml/hr to keep IV patent/minimal PO intake overnight.   Voided twice overnight: 771ml at 0015, 900 at 0400   Assessment/Plan: 33 y.o. G1P0101 postpartum day # 2 with severe Pre-e at [redacted]w[redacted]d  - plan transfer to routine PP unit, discussed POC with Dr Ouida Sills.  - BP stable, mild range  - saline lock IV access, continue I/Os q2hrs.  - encouraged IS use every hour, TCDB hourly - pain mgmt as ordered with tylenol, gabapentin, oxycodone.  - remove dressing from incision today, may shower.  - lactation prn  Disposition: to postpartum unit   Francetta Found, CNM 07/01/2019  10:37 AM

## 2019-07-01 NOTE — Progress Notes (Signed)
Patient requesting colace. RN spoke with CNM, R. McVey. Verbal order placed for colace BID. CNM also updated about patient's elevated blood pressures. No new orders at this time. Patient to receive medicine if blood pressure exceeds 160/110, per order. Patient to be saline locked per CNM.

## 2019-07-02 LAB — COMPREHENSIVE METABOLIC PANEL
ALT: 15 U/L (ref 0–44)
AST: 25 U/L (ref 15–41)
Albumin: 1.8 g/dL — ABNORMAL LOW (ref 3.5–5.0)
Alkaline Phosphatase: 96 U/L (ref 38–126)
Anion gap: 9 (ref 5–15)
BUN: 18 mg/dL (ref 6–20)
CO2: 22 mmol/L (ref 22–32)
Calcium: 7.4 mg/dL — ABNORMAL LOW (ref 8.9–10.3)
Chloride: 108 mmol/L (ref 98–111)
Creatinine, Ser: 1.26 mg/dL — ABNORMAL HIGH (ref 0.44–1.00)
GFR calc Af Amer: >60 mL/min (ref >60)
GFR calc non Af Amer: 56 mL/min — ABNORMAL LOW (ref >60)
Glucose, Bld: 102 mg/dL — ABNORMAL HIGH (ref 70–99)
Potassium: 4.1 mmol/L (ref 3.5–5.1)
Sodium: 139 mmol/L (ref 135–145)
Total Bilirubin: 0.3 mg/dL (ref 0.3–1.2)
Total Protein: 4.7 g/dL — ABNORMAL LOW (ref 6.5–8.1)

## 2019-07-02 LAB — CBC
HCT: 26.3 % — ABNORMAL LOW (ref 36.0–46.0)
Hemoglobin: 8.4 g/dL — ABNORMAL LOW (ref 12.0–15.0)
MCH: 27.5 pg (ref 26.0–34.0)
MCHC: 31.9 g/dL (ref 30.0–36.0)
MCV: 85.9 fL (ref 80.0–100.0)
Platelets: 207 10*3/uL (ref 150–400)
RBC: 3.06 MIL/uL — ABNORMAL LOW (ref 3.87–5.11)
RDW: 18.8 % — ABNORMAL HIGH (ref 11.5–15.5)
WBC: 15.7 10*3/uL — ABNORMAL HIGH (ref 4.0–10.5)
nRBC: 0 % (ref 0.0–0.2)

## 2019-07-02 MED ORDER — SENNOSIDES-DOCUSATE SODIUM 8.6-50 MG PO TABS: 2.0000 | ORAL_TABLET | ORAL | 0 refills | Status: DC

## 2019-07-02 MED ORDER — SIMETHICONE 80 MG PO CHEW: 80.0000 mg | CHEWABLE_TABLET | Freq: Three times a day (TID) | ORAL | 0 refills | Status: DC

## 2019-07-02 MED ORDER — POLYETHYLENE GLYCOL 3350 17 G PO PACK: 17.0000 g | PACK | Freq: Every day | ORAL | 0 refills | Status: AC | PRN

## 2019-07-02 MED ORDER — ACETAMINOPHEN 500 MG PO TABS: 1000.0000 mg | ORAL_TABLET | Freq: Four times a day (QID) | ORAL | 0 refills | Status: DC

## 2019-07-02 MED ORDER — OXYCODONE HCL 5 MG PO TABS: 5.0000 mg | ORAL_TABLET | ORAL | 0 refills | Status: AC | PRN

## 2019-07-02 MED ORDER — DOCUSATE SODIUM 100 MG PO CAPS: 100.0000 mg | ORAL_CAPSULE | Freq: Two times a day (BID) | ORAL | 0 refills | Status: DC

## 2019-07-02 NOTE — Progress Notes (Signed)
Post Partum Day 3  Subjective: Has been OOB and ambulating with assist,  Continues to void large amounts of urine. Thinks her swelling has decreased a little.  No CP SOB Fever,Chills, N/V or leg pain; denies nipple or breast pain no HA change of vision, RUQ/epigastric pain  Objective: BP 137/71 (BP Location: Right Arm)   Pulse 90   Temp 97.9 F (36.6 C) (Oral)   Resp 16   Ht 5\' 6"  (1.676 m)   Wt 120.3 kg   LMP 10/18/2018   SpO2 97%   Breastfeeding Unknown   BMI 42.81 kg/m    Physical Exam:  General: NAD, generalized edema Breasts: soft/nontender CV: RRR Pulm: nl effort, CTABL Abdomen: soft, NT, BS x 4- passing flatus.  Incision: dressing removed,  incision intact, no erythema or drainage.  Lochia: small Uterine Fundus: fundus firm and 1 fb below umbilicus DVT Evaluation: no cords, ttp LEs, 2+ DTRs; 4+ pedal edema, 2+ edema to knees.   Recent Labs    07/01/19 0712 07/02/19 0624  HGB 10.4* 8.4*  HCT 32.6* 26.3*  WBC 18.8* 15.7*  PLT 213 207   CMP 8/30 at 0624 Creatinine: 1.26 H Sodium: 139 Potassium 4.1 BUN 18 GFR 56 L Normal AST/ALT   Voiding 700-1031ml every 4hrs   Assessment/Plan: 33 y.o. G1P0101 postpartum day # 3 with severe Pre-e at [redacted]w[redacted]d  - continue routine PP care  - BP stable, mild range  - DC IV - encouraged IS use every hour, ambulation and shower today.  - pain mgmt as ordered with tylenol, gabapentin, oxycodone.  - lactation prn  Disposition: dc home today   Francetta Found, CNM 07/02/2019  10:54 AM

## 2019-07-02 NOTE — Progress Notes (Signed)
Period of purple cry and CPR videos shown to patient and husband. Discharge instructions given and prescriptions sent to pharmacy by provider. Patient verbalizes understanding of teaching. Patient discharged home via wheelchair at 1615.

## 2019-07-02 NOTE — Progress Notes (Signed)
Patient up to the shower with nursing instructor and student nurse. Patient using shower chair. No complaints of dizziness or lightheadedness. Patient asked SN to step out to give her privacy, stating that her husband would help her. Safety measures and safety cord explained to patient. Patient verbalizes understanding.

## 2019-07-04 ENCOUNTER — Inpatient Hospital Stay
Admission: EM | Admit: 2019-07-04 | Discharge: 2019-07-07 | DRG: 776 | Disposition: A | Discharge status: Home / Self Care | Payer: BC Managed Care – PPO | Attending: Obstetrics and Gynecology | Admitting: Obstetrics and Gynecology

## 2019-07-04 ENCOUNTER — Other Ambulatory Visit: Payer: Self-pay

## 2019-07-04 ENCOUNTER — Inpatient Hospital Stay: Payer: BC Managed Care – PPO

## 2019-07-04 DIAGNOSIS — O1415 Severe pre-eclampsia, complicating the puerperium: Secondary | ICD-10-CM | POA: Diagnosis not present

## 2019-07-04 DIAGNOSIS — Z20828 Contact with and (suspected) exposure to other viral communicable diseases: Secondary | ICD-10-CM | POA: Diagnosis not present

## 2019-07-04 DIAGNOSIS — R0602 Shortness of breath: Secondary | ICD-10-CM | POA: Diagnosis not present

## 2019-07-04 DIAGNOSIS — R600 Localized edema: Secondary | ICD-10-CM | POA: Diagnosis not present

## 2019-07-04 DIAGNOSIS — R51 Headache: Secondary | ICD-10-CM | POA: Diagnosis not present

## 2019-07-04 DIAGNOSIS — J180 Bronchopneumonia, unspecified organism: Secondary | ICD-10-CM | POA: Diagnosis not present

## 2019-07-04 DIAGNOSIS — I10 Essential (primary) hypertension: Secondary | ICD-10-CM | POA: Diagnosis not present

## 2019-07-04 HISTORY — DX: Gestational (pregnancy-induced) hypertension without significant proteinuria, unspecified trimester: O13.9

## 2019-07-04 LAB — COMPREHENSIVE METABOLIC PANEL
ALT: 25 U/L (ref 0–44)
AST: 28 U/L (ref 15–41)
Albumin: 2.5 g/dL — ABNORMAL LOW (ref 3.5–5.0)
Alkaline Phosphatase: 133 U/L — ABNORMAL HIGH (ref 38–126)
Anion gap: 10 (ref 5–15)
BUN: 12 mg/dL (ref 6–20)
CO2: 19 mmol/L — ABNORMAL LOW (ref 22–32)
Calcium: 8.3 mg/dL — ABNORMAL LOW (ref 8.9–10.3)
Chloride: 109 mmol/L (ref 98–111)
Creatinine, Ser: 1 mg/dL (ref 0.44–1.00)
GFR calc Af Amer: >60 mL/min (ref >60)
GFR calc non Af Amer: >60 mL/min (ref >60)
Glucose, Bld: 88 mg/dL (ref 70–99)
Potassium: 4.7 mmol/L (ref 3.5–5.1)
Sodium: 138 mmol/L (ref 135–145)
Total Bilirubin: 0.2 mg/dL — ABNORMAL LOW (ref 0.3–1.2)
Total Protein: 5.6 g/dL — ABNORMAL LOW (ref 6.5–8.1)

## 2019-07-04 LAB — CBC
HCT: 28.9 % — ABNORMAL LOW (ref 36.0–46.0)
Hemoglobin: 9.4 g/dL — ABNORMAL LOW (ref 12.0–15.0)
MCH: 27.7 pg (ref 26.0–34.0)
MCHC: 32.5 g/dL (ref 30.0–36.0)
MCV: 85.3 fL (ref 80.0–100.0)
Platelets: 206 10*3/uL (ref 150–400)
RBC: 3.39 MIL/uL — ABNORMAL LOW (ref 3.87–5.11)
RDW: 18.6 % — ABNORMAL HIGH (ref 11.5–15.5)
WBC: 12.9 10*3/uL — ABNORMAL HIGH (ref 4.0–10.5)
nRBC: 0 % (ref 0.0–0.2)

## 2019-07-04 LAB — PROTEIN / CREATININE RATIO, URINE
Creatinine, Urine: 13 mg/dL
Protein Creatinine Ratio: 0.54 mg/mg{Cre} — ABNORMAL HIGH (ref 0.00–0.15)
Total Protein, Urine: 7 mg/dL

## 2019-07-04 MED ORDER — LABETALOL HCL 5 MG/ML IV SOLN
80.0000 mg | INTRAVENOUS | Status: DC | PRN
  Filled 2019-07-04: qty 16

## 2019-07-04 MED ORDER — IOHEXOL 350 MG/ML SOLN
100.0000 mL | Freq: Once | INTRAVENOUS | Status: AC | PRN
  Administered 2019-07-04: 100 mL via INTRAVENOUS

## 2019-07-04 MED ORDER — NIFEDIPINE 10 MG PO CAPS
10.0000 mg | ORAL_CAPSULE | ORAL | Status: DC | PRN
  Administered 2019-07-04 (×2): 10 mg via ORAL
  Filled 2019-07-04: qty 1

## 2019-07-04 MED ORDER — OXYCODONE HCL 5 MG PO TABS: 5.0000 mg | ORAL_TABLET | Freq: Four times a day (QID) | ORAL | Status: DC | PRN

## 2019-07-04 MED ORDER — MAGNESIUM SULFATE BOLUS VIA INFUSION
4.0000 g | Freq: Once | INTRAVENOUS | Status: AC
  Administered 2019-07-04: 4 g via INTRAVENOUS
  Filled 2019-07-04: qty 500

## 2019-07-04 MED ORDER — MAGNESIUM SULFATE 40 G IN LACTATED RINGERS - SIMPLE
2.0000 g/h | INTRAVENOUS | Status: DC
  Filled 2019-07-04: qty 500

## 2019-07-04 MED ORDER — CALCIUM GLUCONATE 10 % IV SOLN
INTRAVENOUS | Status: AC
  Filled 2019-07-04: qty 10

## 2019-07-04 MED ORDER — ACETAMINOPHEN 500 MG PO TABS
ORAL_TABLET | ORAL | Status: AC
  Filled 2019-07-04: qty 2

## 2019-07-04 MED ORDER — LABETALOL HCL 5 MG/ML IV SOLN
20.0000 mg | INTRAVENOUS | Status: DC | PRN
  Administered 2019-07-05 (×2): 20 mg via INTRAVENOUS
  Filled 2019-07-04 (×3): qty 4

## 2019-07-04 MED ORDER — HYDRALAZINE HCL 20 MG/ML IJ SOLN: 10.0000 mg | INTRAMUSCULAR | Status: DC | PRN

## 2019-07-04 MED ORDER — LABETALOL HCL 5 MG/ML IV SOLN
40.0000 mg | INTRAVENOUS | Status: DC | PRN
  Filled 2019-07-04: qty 8

## 2019-07-04 MED ORDER — ACETAMINOPHEN 500 MG PO TABS
1000.0000 mg | ORAL_TABLET | Freq: Three times a day (TID) | ORAL | Status: DC | PRN
  Administered 2019-07-04 – 2019-07-06 (×6): 1000 mg via ORAL
  Filled 2019-07-04 (×5): qty 2

## 2019-07-04 MED ORDER — NIFEDIPINE 10 MG PO CAPS
ORAL_CAPSULE | ORAL | Status: AC
  Filled 2019-07-04: qty 1

## 2019-07-04 NOTE — H&P (Signed)
Consult History and Physical   SERVICE: Gynecology   Patient Name: Lynn Bean Patient MRN:   [id number]  CC: SOB  HPI: Danielle L CHITWOOD is a 33 y.o. G1P0101 who is 5 days postop from a primary LTCS for arrest of dilation with severe preE, s/p mag x24hrs postpartum, with excellent urine output at home. She did have renal insult last week prior to delivery, but Cr- wnl on admission.  Presenting with orthopnea, headache starting around time of admission but not previously, continued lower extremity swelling, and severe range BP in the office. Significantly elevated heartrate to 120s with   Elevated WBC on admission but lower than at discharge, pt is afebrile.   Review of Systems: positives in bold GEN:   fevers, chills, weight changes, appetite changes, fatigue, night sweats HEENT:  HA, vision changes, hearing loss, congestion, rhinorrhea, sinus pressure, dysphagia CV:   CP, palpitations PULM:  SOB, cough GI:  abd pain, N/V/D/C GU:  dysuria, urgency, frequency  NEURO:  numbness, weakness, tingling, seizures, dizziness, tremors  Past Obstetrical History: OB History    Gravida  1   Para  1   Term  0   Preterm  1   AB  0   Living  1     SAB  0   TAB  0   Ectopic  0   Multiple  0   Live Births  1           Past Medical History: Past Medical History:  Diagnosis Date  . Acne   . ADD (attention deficit disorder)   . Depression   . 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 Pregnancy induced hypertension   . Ruptured disc, thoracic 2007    Past Surgical History:   Past Surgical History:  Procedure Laterality Date  . CESAREAN SECTION N/A 06/29/2019   Procedure: CESAREAN SECTION;  Surgeon: Ouida Sills, Gwen Her, MD;  Location: [facility] ORS;  Service: Obstetrics;  Laterality: N/A;  . NO PAST SURGERIES      Family History:  family history includes Hypertension in her father; Lymphoma in her paternal  grandfather; Prostate cancer in her paternal grandfather.  Social History:  Social History   Socioeconomic History  . Marital status: Married    Spouse name: Ezzard Flax  . Number of children: Not on file  . Years of education: Not on file  . Highest education level: Not on file  Occupational History  . Occupation: Theatre manager  Social Needs  . Financial resource strain: Not hard at all  . Food insecurity    Worry: Never true    Inability: Never true  . Transportation needs    Medical: No    Non-medical: No  Tobacco Use  . Smoking status: Never Smoker  . Smokeless tobacco: Never Used  Substance and Sexual Activity  . Alcohol use: Not Currently  . Drug use: Never  . Sexual activity: Yes    Comment: planning nuvaring  Lifestyle  . Physical activity    Days per week: 5 days    Minutes per session: 30 min  . Stress: To some extent  Relationships  . Social connections    Talks on phone: More than three times a week    Gets together: More than three times a week    Attends religious service: More than 4 times per year    Active member of club or organization: No  Attends meetings of clubs or organizations: Never    Relationship status: Married  . Intimate partner violence    Fear of current or ex partner: Not on file    Emotionally abused: Not on file    Physically abused: Not on file    Forced sexual activity: Not on file  Other Topics Concern  . 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   Graduated from [employer].   Enjoys playing golf, exercising. :)    Home Medications:  Medications reconciled in EPIC  No current facility-administered medications on file prior to encounter.    Current Outpatient Medications on File Prior to Encounter  Medication Sig Dispense Refill  . acetaminophen (TYLENOL) 500 MG  tablet Take 2 tablets (1,000 mg total) by mouth 4 (four) times daily. 30 tablet 0  . docusate sodium (COLACE) 100 MG capsule Take 1 capsule (100 mg total) by mouth 2 (two) times daily. 60 capsule 0  . oxyCODONE (OXY IR/ROXICODONE) 5 MG immediate release tablet Take 1 tablet (5 mg total) by mouth every 4 (four) hours as needed for up to 5 days for moderate pain. 30 tablet 0  . polyethylene glycol (MIRALAX / GLYCOLAX) 17 g packet Take 17 g by mouth daily as needed for mild constipation. 30 each 0  . Prenatal Vit-Fe Fumarate-FA (PRENATAL MULTIVITAMIN) TABS tablet Take 1 tablet by mouth daily at 12 noon.    . senna-docusate (SENOKOT-S) 8.6-50 MG tablet Take 2 tablets by mouth daily. 60 tablet 0  . simethicone (MYLICON) 80 MG chewable tablet Chew 1 tablet (80 mg total) by mouth 3 (three) times daily after meals. 30 tablet 0    Allergies:  Allergies  Allergen Reactions  . Benadryl [Diphenhydramine]     Only brand name benadryl    Physical Exam:  Temp:  [98.3 F (36.8 C)-98.4 F (36.9 C)] 98.3 F (36.8 C) (09/01 2035) Pulse Rate:  [85-135] 110 (09/01 2213) Resp:  [19-20] 19 (09/01 2213) BP: (124-170)/(79-97) 150/85 (09/01 2213) SpO2:  [94 %-98 %] 97 % (09/01 2213) Weight:  [112.9 kg] 112.9 kg (09/01 1717)   General Appearance:  Well developed, well nourished, no acute distress, alert and oriented x3 HEENT:  Normocephalic atraumatic, extraocular movements intact, moist mucous membranes Cardiovascular:  Normal S1/S2, regular rhythm, tachycardia, no murmurs Pulmonary:  clear to auscultation, no wheezes, rales or rhonchi, symmetric air entry, good air exchange. IN office, rales heard bilateral lower lobes, not present on admission Abdomen:  Bowel sounds present, soft, nontender, nondistended, no abnormal masses, no epigastric pain, incision intact Extremities:  Full range of motion, 3+ pedal edema to knee on left and 3+ to mid-tibia on right +3 reflexes in lower extremities, 1 beat of  clonus  Neurologic:  Cranial nerves 2-12 grossly intact, normal muscle tone, Psychiatric:  Normal mood and affect, appropriate, no AH/VH    Labs/Studies:   CBC and Coags:  Lab Results  Component Value Date   WBC 12.9 (H) 07/04/2019   NEUTOPHILPCT 82 07/01/2019   EOSPCT 2 07/01/2019   BASOPCT 0 07/01/2019   LYMPHOPCT 9 07/01/2019   HGB 9.4 (L) 07/04/2019   HCT 28.9 (L) 07/04/2019   MCV 85.3 07/04/2019   PLT 206 07/04/2019   CMP:  Lab Results  Component Value Date   NA 138 07/04/2019  K 4.7 07/04/2019   CL 109 07/04/2019   CO2 19 (L) 07/04/2019   BUN 12 07/04/2019   CREATININE 1.00 07/04/2019   CREATININE 1.26 (H) 07/02/2019   CREATININE 1.60 (H) 07/01/2019   PROT 5.6 (L) 07/04/2019   BILITOT 0.2 (L) 07/04/2019   ALT 25 07/04/2019   AST 28 07/04/2019   ALKPHOS 133 (H) 07/04/2019    Other Imaging: Dg Chest Port 1 View  Result Date: 07/04/2019 CLINICAL DATA:  33 year old female with acute shortness of breath. EXAM: PORTABLE CHEST 1 VIEW COMPARISON:  None. FINDINGS: The cardiomediastinal silhouette is unremarkable. Bilateral interstitial opacities are present. There is no evidence of focal airspace disease, pulmonary edema, suspicious pulmonary nodule/mass, pleural effusion, or pneumothorax. No acute bony abnormalities are identified. IMPRESSION: Nonspecific bilateral interstitial opacities, most likely representing edema or infection. No pleural effusion or pneumothorax. Electronically Signed   By: Margarette Canada M.D.   On: 07/04/2019 19:10     Assessment / Plan:   Kieanna L CADWALADER is a 33 y.o. G1P0101 who presents with sx of severe PreE postpartum  1. Antihypertensives per protocol. Restart mag 4/2 for now. 2. Chest xray for r/o pulm edema, CTA for PE 3. SCDs, compression stockings 4. Weigh patient, Is and Os 5. Monitor closely 6. Repeat labs reassuring with improved kidney function

## 2019-07-04 NOTE — OB Triage Note (Signed)
Pt presented to L/D from office with reported swelling, SOB, and increased BP. 2+ reflexes, 1 beat clonus. No headache, blurry vision, or epigastric pain. Last BP 162/91. O2 sat 99.Will continue to monitor.

## 2019-07-05 DIAGNOSIS — O1415 Severe pre-eclampsia, complicating the puerperium: Secondary | ICD-10-CM | POA: Diagnosis present

## 2019-07-05 LAB — SARS CORONAVIRUS 2 (TAT 6-24 HRS): SARS Coronavirus 2: NEGATIVE

## 2019-07-05 MED ORDER — SIMETHICONE 80 MG PO CHEW
160.0000 mg | CHEWABLE_TABLET | Freq: Four times a day (QID) | ORAL | Status: DC
  Administered 2019-07-05 – 2019-07-07 (×6): 160 mg via ORAL
  Filled 2019-07-05 (×6): qty 2

## 2019-07-05 MED ORDER — LACTATED RINGERS IV SOLN
INTRAVENOUS | Status: DC
  Administered 2019-07-04: 20:00:00 via INTRAVENOUS

## 2019-07-05 MED ORDER — LABETALOL HCL 5 MG/ML IV SOLN
INTRAVENOUS | Status: AC
  Filled 2019-07-05: qty 4

## 2019-07-05 MED ORDER — LABETALOL HCL 200 MG PO TABS
200.0000 mg | ORAL_TABLET | Freq: Two times a day (BID) | ORAL | Status: DC
  Administered 2019-07-05 (×2): 200 mg via ORAL
  Filled 2019-07-05: qty 1
  Filled 2019-07-05: qty 2

## 2019-07-05 MED ORDER — LABETALOL HCL 5 MG/ML IV SOLN
10.0000 mg | Freq: Once | INTRAVENOUS | Status: AC
  Administered 2019-07-05: 10 mg via INTRAVENOUS

## 2019-07-05 MED ORDER — FUROSEMIDE 10 MG/ML IJ SOLN
40.0000 mg | Freq: Four times a day (QID) | INTRAMUSCULAR | Status: DC
  Filled 2019-07-05: qty 4

## 2019-07-05 NOTE — Progress Notes (Addendum)
Chest Xray states no pulm edema or acute findings, but "non-specific bilateral interstitial opacities, most likely edema or infection".   CTA with NO pulmonary embolus but did find "small to moderate-sized bilateral pleural effusions with adjacent compressive atelectasis and ground-glass airspace opacities in the RUL and LUL" suggestive of volume overload and developing pulmonary edema.   She received one dose iv labetalol overnight for pressures of 162/8. Urine output 962ml/hr, kidney labs improved and normal since first admission  Covid repeat negative  Vitals:   07/05/19 0845 07/05/19 0850  BP:    Pulse:    Resp:    Temp:    SpO2: 98% 98%   PE: Lower ext: pitting edema to ankle on right, and mid-tibial on left Pulm: very mild right basilar crackles, decreased flow bilaterally to mid lungs  A: Pulm edema, in setting of recent severe PreE, possible volume overload P:  - stop iv mag - KVO fluids, continue po hydration -continue diuresis. Consider lasix addition if clinically indicated -O2 support if needed, though satting well now -keep on L&D while still elevated bp Discussion of possibility of home antihypertensives with patient and husband.

## 2019-07-06 MED ORDER — LABETALOL HCL 200 MG PO TABS
400.0000 mg | ORAL_TABLET | Freq: Two times a day (BID) | ORAL | Status: DC
  Administered 2019-07-06: 400 mg via ORAL
  Filled 2019-07-06: qty 2

## 2019-07-06 MED ORDER — PRENATAL MULTIVITAMIN CH
1.0000 | ORAL_TABLET | Freq: Every day | ORAL | Status: DC
  Administered 2019-07-06: 1 via ORAL
  Filled 2019-07-06: qty 1

## 2019-07-06 MED ORDER — LABETALOL HCL 200 MG PO TABS
300.0000 mg | ORAL_TABLET | Freq: Two times a day (BID) | ORAL | Status: DC
  Administered 2019-07-06: 300 mg via ORAL
  Filled 2019-07-06: qty 1

## 2019-07-06 MED ORDER — FERROUS SULFATE 325 (65 FE) MG PO TABS
325.0000 mg | ORAL_TABLET | Freq: Every day | ORAL | Status: DC
  Administered 2019-07-06 – 2019-07-07 (×2): 325 mg via ORAL
  Filled 2019-07-06 (×2): qty 1

## 2019-07-06 NOTE — Progress Notes (Signed)
Obstetric and Gynecology  HD # 3  Subjective  Patient doing well, no complaints, tolerating PO intake, tolerating pain with PO meds, ambulating without difficulty, voiding spontaneously.     Denies CP, SOB, F/C, N/V/D, or leg pain.  Objective  Objective:   Vitals:   07/06/19 1341 07/06/19 1615 07/06/19 1858 07/06/19 1913  BP: (!) 146/78 (!) 148/89 (!) 168/90 (!) 170/97  Pulse: 89 83 84 85  Resp:  18  20  Temp:  99.2 F (37.3 C)  98.7 F (37.1 C)  TempSrc:  Oral  Oral  SpO2:    99%  Weight:      Height:       Temp:  [98.5 F (36.9 C)-99.2 F (37.3 C)] 98.7 F (37.1 C) (09/03 1913) Pulse Rate:  [81-94] 85 (09/03 1913) Resp:  [16-20] 20 (09/03 1913) BP: (145-170)/(73-97) 170/97 (09/03 1913) SpO2:  [96 %-99 %] 99 % (09/03 1913) I/O last 3 completed shifts: In: 1602.5 [P.O.:1320; I.V.:282.5] Out: 7050 [Urine:7050] No intake/output data recorded.  Intake/Output Summary (Last 24 hours) at 07/06/2019 1939 Last data filed at 07/06/2019 1515 Gross per 24 hour  Intake 840 ml  Output 3600 ml  Net -2760 ml     Current Vital Signs 24h Vital Sign Ranges  T 98.7 F (37.1 C) Temp  Avg: 98.8 F (37.1 C)  Min: 98.5 F (36.9 C)  Max: 99.2 F (37.3 C)  BP (!) 170/97 BP  Min: 145/73  Max: 170/97  HR 85 Pulse  Avg: 85.9  Min: 81  Max: 94  RR 20 Resp  Avg: 18.6  Min: 16  Max: 20  SaO2 99 % Room Air SpO2  Avg: 98 %  Min: 96 %  Max: 99 %           24 Hour I/O Current Shift I/O  Time Ins Outs 09/02 0701 - 09/03 0700 In: 1242.5 [P.O.:960; I.V.:282.5] Out: 5950 [Urine:5950] No intake/output data recorded.   Physical exam: General: alert, cooperative, appears stated age and mild distress Breasts: soft/nontender CV: RRR Pulm: nl effort, CTABL Abdomen: soft, non-tender, active bowel sounds Uterine Fundus: firm Incision: healing well, no significant drainage, no dehiscence, no significant erythema Lochia: appropriate DVT Evaluation: No evidence of DVT seen on physical exam. No  cords or calf tenderness. +2 LLE pitting edema. +1 RLE edema.   Labs: No results found for this or any previous visit (from the past 24 hour(s)).  Cultures: Results for orders placed or performed during the hospital encounter of 07/04/19  SARS CORONAVIRUS 2 (TAT 6-24 HRS) Nasopharyngeal Nasopharyngeal Swab     Status: None   Collection Time: 07/04/19 11:14 PM   Specimen: Nasopharyngeal Swab  Result Value Ref Range Status   SARS Coronavirus 2 NEGATIVE NEGATIVE Final    Comment: (NOTE) SARS-CoV-2 target nucleic acids are NOT DETECTED. The SARS-CoV-2 RNA is generally detectable in upper and lower respiratory specimens during the acute phase of infection. Negative results do not preclude SARS-CoV-2 infection, do not rule out co-infections with other pathogens, and should not be used as the sole basis for treatment or other patient management decisions. Negative results must be combined with clinical observations, patient history, and epidemiological information. The expected result is Negative. Fact Sheet for Patients: SugarRoll.be Fact Sheet for Healthcare Providers: https://www.woods-mathews.com/ This test is not yet approved or cleared by the [place] FDA and  has been authorized for detection and/or diagnosis of SARS-CoV-2 by FDA under an Emergency Use Authorization (EUA). This EUA will remain  in effect (meaning this test can be used) for the duration of the COVID-19 declaration under Section 56 4(b)(1) of the Act, 21 U.S.C. section 360bbb-3(b)(1), unless the authorization is terminated or revoked sooner. Performed at [facility], [place] [address] [place], [place] [address]     Imaging: Ct Angio Chest Pe W Or Wo Contrast  Result Date: 07/04/2019 CLINICAL DATA:  Shortness of breath status post recent surgery. EXAM: CT ANGIOGRAPHY CHEST WITH CONTRAST TECHNIQUE: Multidetector CT imaging of the chest was performed using the  standard protocol during bolus administration of intravenous contrast. Multiplanar CT image reconstructions and MIPs were obtained to evaluate the vascular anatomy. CONTRAST:  174mL OMNIPAQUE IOHEXOL 350 MG/ML SOLN COMPARISON:  None. FINDINGS: Cardiovascular: Contrast injection is sufficient to demonstrate satisfactory opacification of the pulmonary arteries to the segmental level. There is no pulmonary embolus. The main pulmonary artery is within normal limits for size. There is no CT evidence of acute right heart strain. The visualized aorta is normal. Heart size is normal, without pericardial effusion. Mediastinum/Nodes: --No mediastinal or hilar lymphadenopathy. --No axillary lymphadenopathy. --No supraclavicular lymphadenopathy. --Normal thyroid gland. --The esophagus is unremarkable Lungs/Pleura: There are small to moderate-sized bilateral pleural effusions with adjacent compressive atelectasis. There are few ground-glass airspace opacities in the right upper lobe and left upper lobe. There is some interlobular septal thickening. There is no pneumothorax. Upper Abdomen: There is a probable nonobstructing stone in the upper pole the left kidney. This is only partially visualized. Musculoskeletal: No chest wall abnormality. No acute or significant osseous findings. Review of the MIP images confirms the above findings. IMPRESSION: 1. No acute pulmonary embolus. 2. Constellation of findings suggestive of volume overload and developing pulmonary edema including small to moderate-sized bilateral pleural effusions, interlobular septal thickening, and ground-glass airspace opacities in the upper lobes. Other differential considerations for the ground-glass airspace opacities include an atypical infectious process in the appropriate clinical setting. 3. Probable nonobstructing stone in the upper pole the left kidney. Electronically Signed   By: Constance Holster M.D.   On: 07/04/2019 23:47   Dg Chest Port 1  View  Result Date: 07/04/2019 CLINICAL DATA:  33 year old female with acute shortness of breath. EXAM: PORTABLE CHEST 1 VIEW COMPARISON:  None. FINDINGS: The cardiomediastinal silhouette is unremarkable. Bilateral interstitial opacities are present. There is no evidence of focal airspace disease, pulmonary edema, suspicious pulmonary nodule/mass, pleural effusion, or pneumothorax. No acute bony abnormalities are identified. IMPRESSION: Nonspecific bilateral interstitial opacities, most likely representing edema or infection. No pleural effusion or pneumothorax. Electronically Signed   By: Margarette Canada M.D.   On: 07/04/2019 19:10     Assessment   33 y.o. [place] [facility] Day: 3   Hypertension  Plan   1. Hypertension: -Patient had been with mild range blood pressures all day, with spike to severe range pressures this evening. -Increase PO labetalol from 300mg  BID to 400mg  BID. If patient continues to have moderate or severe range blood pressures, consider adding on Procardia 30mg /day.  -If blood pressures return to normal or mild range, likely discharge home on oral antihypertensives.   2. Pulmonary edema, resolved: -Voiding large amounts -B/l lungs clear to auscultation -No concerns for shortness of breath. Normal oxygen saturation on room air.  3. Postpartum: -Lochia WNL -Continue pumping to maintain milk supply. Pumped milk before store to send home for baby.  -Patient and her husband distraught at having to continue to be separated from baby. Emotional support provided. Reviewed importance of blood pressure management  prior to discharge, of which they are completely understanding.   Discussed with Dr. Ouida Sills, who is in agreement with the above plan.    Lisette Grinder, [place] 07/06/2019 7:45 PM

## 2019-07-06 NOTE — Progress Notes (Signed)
Patient had mag sulfate stopped and was started on PO Labetalol 200 BID.  She had elevated pressures again mid-afternoon and was given 10mg  IV labetalol as she had just been given PO. She has maintained mild range pressures since.  No complaints.   Voiding spontaneously and well Transfer to floor  ----- Larey Days, MD, [place] Attending Obstetrician and Gynecologist [facility], Department of [facility]

## 2019-07-06 NOTE — Progress Notes (Signed)
*  [facility]  Subjective: Doing well, no concerns. Ambulating without difficulty, pain managed with PO meds, tolerating regular diet, and voiding without difficulty.   No fever/chills, chest pain, shortness of breath, nausea/vomiting, or leg pain. No nipple or breast pain. No headache, visual changes, or RUQ/epigastric pain.  Objective: BP (!) 145/73 (BP Location: Left Arm)   Pulse 81   Temp 98.9 F (37.2 C) (Oral)   Resp 18   Ht 5\' 6"  (1.676 m)   Wt 112.9 kg   SpO2 99% Comment: Room Air  BMI 40.19 kg/m    Physical Exam:  General: alert, cooperative, appears stated age and no distress Pulm: nl effort DVT Evaluation: No evidence of DVT seen on physical exam. No cords or calf tenderness. +2 LLE pitting edema, +1 RLE edema  Recent Labs    07/04/19 1825  HGB 9.4*  HCT 28.9*  WBC 12.9*  PLT 206    Assessment/Plan: 33 y.o. G1P0101 hospital day 3 after readmission for shortness of breath, lower extremity swelling, and severe range BP  -Pumping and storing milk for baby, no concerns.  -Feeling well, no shortness of breath, ambulating without difficulty, continues to void large amounts.  -Blood pressures mild range today since increase of labetalol to 300mg  PO BID.   Disposition: Continue inpatient care, with possible discharge to home later today dependant on blood pressures.    LOS: 2 days   Lisette Grinder, CNM 07/06/2019, 12:10 PM   ----- Lisette Grinder Certified Nurse Midwife [facility]

## 2019-07-07 MED ORDER — LABETALOL HCL 200 MG PO TABS
400.0000 mg | ORAL_TABLET | Freq: Three times a day (TID) | ORAL | Status: DC
  Administered 2019-07-07: 400 mg via ORAL
  Filled 2019-07-07: qty 2

## 2019-07-07 MED ORDER — LABETALOL HCL 200 MG PO TABS: 400.0000 mg | ORAL_TABLET | Freq: Three times a day (TID) | ORAL | 0 refills | Status: DC

## 2019-07-07 MED ORDER — NORETHINDRONE 0.35 MG PO TABS: 1.0000 | ORAL_TABLET | Freq: Every day | ORAL | 11 refills | Status: DC

## 2019-07-07 NOTE — Progress Notes (Signed)
Pt discharged. Discharge instructions, prescriptions, and follow up appointments given to and reviewed with patient. Pt verbalized understanding. Escorted out by staff.

## 2019-07-07 NOTE — Discharge Summary (Addendum)
GynecologicalDischarge Summary  Patient Name: Lynn Bean DOB: 05-21-1986 MRN: [id number]  Date of Admission: 07/04/2019 Date of Discharge: 07/07/2019  Hospital course:   33 y.o. G1P0101 who was admitted 5 days postop from a primary LTCS for arrest of dilation with severe preE, s/p mag x24hrs postpartum.  On admission, severe range BP, mild headache, pitting edema to the knees and mild pulm edema with low sats. CT scan and chest xray confirmed.  Mag restarted, and stopped after a day when pulm edema confirmed.  Diureses without meds, received antihypertensives, and on day of discharge started 400mg  TIB labetalol. Asx at time of discharge/   Discharge Physical Exam: BP (!) 149/77 (BP Location: Left Arm) Comment: nurse Maddy B. notified  Pulse 83   Temp 98.5 F (36.9 C) (Oral)   Resp 20   Ht 5\' 6"  (1.676 m)   Wt 105.4 kg   SpO2 97% Comment: Room Air  BMI 37.51 kg/m   General: NAD CV: RRR Pulm: CTABL, nl effort ABD: s/nd/nt Incision: c/d/i  DVT Evaluation: LE non-ttp, no evidence of DVT on exam. LE edema resolved bilaterally,   Hemoglobin  Date Value Ref Range Status  07/04/2019 9.4 (L) 12.0 - 15.0 g/dL Final   HCT  Date Value Ref Range Status  07/04/2019 28.9 (L) 36.0 - 46.0 % Final      Plan:  Lynn Bean was discharged to home in good condition. Follow-up appointment at [facility] in 3 days, and will call tomorrow and Sunday with BP range  - labetalol 400mg  TID - blood pressure lof - PreE precautions given   Discharge Medications: Allergies as of 07/07/2019      Reactions   Benadryl [diphenhydramine]    Only brand name benadryl      Medication List    TAKE these medications   acetaminophen 500 MG tablet Commonly known as: TYLENOL Take 2 tablets (1,000 mg total) by mouth 4 (four) times daily.   docusate sodium 100 MG capsule Commonly known as: COLACE Take 1 capsule (100 mg total) by mouth 2 (two) times daily.   labetalol 200  MG tablet Commonly known as: NORMODYNE Take 2 tablets (400 mg total) by mouth 3 (three) times daily.   norethindrone 0.35 MG tablet Commonly known as: Ortho Micronor Take 1 tablet (0.35 mg total) by mouth daily.   oxyCODONE 5 MG immediate release tablet Commonly known as: Oxy IR/ROXICODONE Take 1 tablet (5 mg total) by mouth every 4 (four) hours as needed for up to 5 days for moderate pain.   polyethylene glycol 17 g packet Commonly known as: MIRALAX / GLYCOLAX Take 17 g by mouth daily as needed for mild constipation.   prenatal multivitamin Tabs tablet Take 1 tablet by mouth daily at 12 noon.   senna-docusate 8.6-50 MG tablet Commonly known as: Senokot-S Take 2 tablets by mouth daily.   simethicone 80 MG chewable tablet Commonly known as: MYLICON Chew 1 tablet (80 mg total) by mouth 3 (three) times daily after meals.       Follow-up Information    Benjaman Kindler, MD Follow up in 3 day(s).   Specialty: Obstetrics and Gynecology Why: Call for nurse visit for BP check on Monday Please call tomorrow with BP log values Contact information: [place] [address] [phone]           Signed: Benjaman Kindler, MD 10:22 AM

## 2019-07-07 NOTE — Discharge Instructions (Signed)
Preeclampsia and Eclampsia °Preeclampsia is a serious condition that may develop during pregnancy. This condition causes high blood pressure and increased protein in your urine along with other symptoms, such as headaches and vision changes. These symptoms may develop as the condition gets worse. Preeclampsia may occur at 20 weeks of pregnancy or later. °Diagnosing and treating preeclampsia early is very important. If not treated early, it can cause serious problems for you and your baby. One problem it can lead to is eclampsia. Eclampsia is a condition that causes muscle jerking or shaking (convulsions or seizures) and other serious problems for the mother. During pregnancy, delivering your baby may be the best treatment for preeclampsia or eclampsia. For most women, preeclampsia and eclampsia symptoms go away after giving birth. °In rare cases, a woman may develop preeclampsia after giving birth (postpartum preeclampsia). This usually occurs within 48 hours after childbirth but may occur up to 6 weeks after giving birth. °What are the causes? °The cause of preeclampsia is not known. °What increases the risk? °The following risk factors make you more likely to develop preeclampsia: °· Being pregnant for the first time. °· Having had preeclampsia during a past pregnancy. °· Having a family history of preeclampsia. °· Having high blood pressure. °· Being pregnant with more than one baby. °· Being 35 or older. °· Being African-American. °· Having kidney disease or diabetes. °· Having medical conditions such as lupus or blood diseases. °· Being very overweight (obese). °What are the signs or symptoms? °The most common symptoms are: °· Severe headaches. °· Vision problems, such as blurred or double vision. °· Abdominal pain, especially upper abdominal pain. °Other symptoms that may develop as the condition gets worse include: °· Sudden weight gain. °· Sudden swelling of the hands, face, legs, and feet. °· Severe nausea  and vomiting. °· Numbness in the face, arms, legs, and feet. °· Dizziness. °· Urinating less than usual. °· Slurred speech. °· Convulsions or seizures. °How is this diagnosed? °There are no screening tests for preeclampsia. Your health care provider will ask you about symptoms and check for signs of preeclampsia during your prenatal visits. You may also have tests that include: °· Checking your blood pressure. °· Urine tests to check for protein. Your health care provider will check for this at every prenatal visit. °· Blood tests. °· Monitoring your baby's heart rate. °· Ultrasound. °How is this treated? °You and your health care provider will determine the treatment approach that is best for you. Treatment may include: °· Having more frequent prenatal exams to check for signs of preeclampsia, if you have an increased risk for preeclampsia. °· Medicine to lower your blood pressure. °· Staying in the hospital, if your condition is severe. There, treatment will focus on controlling your blood pressure and the amount of fluids in your body (fluid retention). °· Taking medicine (magnesium sulfate) to prevent seizures. This may be given as an injection or through an IV. °· Taking a low-dose aspirin during your pregnancy. °· Delivering your baby early. You may have your labor started with medicine (induced), or you may have a cesarean delivery. °Follow these instructions at home: °Eating and drinking ° °· Drink enough fluid to keep your urine pale yellow. °· Avoid caffeine. °Lifestyle °· Do not use any products that contain nicotine or tobacco, such as cigarettes and e-cigarettes. If you need help quitting, ask your health care provider. °· Do not use alcohol or drugs. °· Avoid stress as much as possible. Rest and get   plenty of sleep. °General instructions °· Take over-the-counter and prescription medicines only as told by your health care provider. °· When lying down, lie on your left side. This keeps pressure off your  major blood vessels. °· When sitting or lying down, raise (elevate) your feet. Try putting some pillows underneath your lower legs. °· Exercise regularly. Ask your health care provider what kinds of exercise are best for you. °· Keep all follow-up and prenatal visits as told by your health care provider. This is important. °How is this prevented? °There is no known way of preventing preeclampsia or eclampsia from developing. However, to lower your risk of complications and detect problems early: °· Get regular prenatal care. Your health care provider may be able to diagnose and treat the condition early. °· Maintain a healthy weight. Ask your health care provider for help managing weight gain during pregnancy. °· Work with your health care provider to manage any long-term (chronic) health conditions you have, such as diabetes or kidney problems. °· You may have tests of your blood pressure and kidney function after giving birth. °· Your health care provider may have you take low-dose aspirin during your next pregnancy. °Contact a health care provider if: °· You have symptoms that your health care provider told you may require more treatment or monitoring, such as: °? Headaches. °? Nausea or vomiting. °? Abdominal pain. °? Dizziness. °? Light-headedness. °Get help right away if: °· You have severe: °? Abdominal pain. °? Headaches that do not get better. °? Dizziness. °? Vision problems. °? Confusion. °? Nausea or vomiting. °· You have any of the following: °? A seizure. °? Sudden, rapid weight gain. °? Sudden swelling in your hands, ankles, or face. °? Trouble moving any part of your body. °? Numbness in any part of your body. °? Trouble speaking. °? Abnormal bleeding. °· You faint. °Summary °· Preeclampsia is a serious condition that may develop during pregnancy. °· This condition causes high blood pressure and increased protein in your urine along with other symptoms, such as headaches and vision  changes. °· Diagnosing and treating preeclampsia early is very important. If not treated early, it can cause serious problems for you and your baby. °· Get help right away if you have symptoms that your health care provider told you to watch for. °This information is not intended to replace advice given to you by your health care provider. Make sure you discuss any questions you have with your health care provider. °Document Released: 10/16/2000 Document Revised: 06/21/2018 Document Reviewed: 05/25/2016 °Elsevier Patient Education © 2020 [employer] ° °

## 2019-07-11 DIAGNOSIS — O1415 Severe pre-eclampsia, complicating the puerperium: Secondary | ICD-10-CM | POA: Diagnosis not present

## 2019-08-15 DIAGNOSIS — Z1332 Encounter for screening for maternal depression: Secondary | ICD-10-CM | POA: Diagnosis not present

## 2020-02-08 DIAGNOSIS — I10 Essential (primary) hypertension: Secondary | ICD-10-CM | POA: Diagnosis not present

## 2020-02-08 DIAGNOSIS — E669 Obesity, unspecified: Secondary | ICD-10-CM | POA: Diagnosis not present

## 2020-02-08 DIAGNOSIS — I471 Supraventricular tachycardia: Secondary | ICD-10-CM | POA: Diagnosis not present

## 2020-02-22 DIAGNOSIS — Z7689 Persons encountering health services in other specified circumstances: Secondary | ICD-10-CM | POA: Diagnosis not present

## 2020-02-22 DIAGNOSIS — R002 Palpitations: Secondary | ICD-10-CM | POA: Diagnosis not present

## 2020-02-22 DIAGNOSIS — R0602 Shortness of breath: Secondary | ICD-10-CM | POA: Diagnosis not present

## 2020-02-22 DIAGNOSIS — I10 Essential (primary) hypertension: Secondary | ICD-10-CM | POA: Diagnosis not present

## 2020-03-21 DIAGNOSIS — R0602 Shortness of breath: Secondary | ICD-10-CM | POA: Diagnosis not present

## 2020-03-27 DIAGNOSIS — R002 Palpitations: Secondary | ICD-10-CM | POA: Diagnosis not present

## 2020-03-27 DIAGNOSIS — I10 Essential (primary) hypertension: Secondary | ICD-10-CM | POA: Diagnosis not present

## 2020-03-27 DIAGNOSIS — R0602 Shortness of breath: Secondary | ICD-10-CM | POA: Diagnosis not present

## 2020-05-01 DIAGNOSIS — J209 Acute bronchitis, unspecified: Secondary | ICD-10-CM | POA: Diagnosis not present

## 2020-06-27 DIAGNOSIS — Z03818 Encounter for observation for suspected exposure to other biological agents ruled out: Secondary | ICD-10-CM | POA: Diagnosis not present

## 2020-06-27 DIAGNOSIS — Z1152 Encounter for screening for COVID-19: Secondary | ICD-10-CM | POA: Diagnosis not present

## 2020-07-02 ENCOUNTER — Other Ambulatory Visit: Payer: Self-pay | Admitting: Otolaryngology

## 2020-07-02 ENCOUNTER — Ambulatory Visit
Admission: RE | Admit: 2020-07-02 | Discharge: 2020-07-02 | Disposition: A | Discharge status: Home / Self Care | Payer: BC Managed Care – PPO | Attending: Otolaryngology | Admitting: Otolaryngology

## 2020-07-02 ENCOUNTER — Ambulatory Visit
Admission: RE | Admit: 2020-07-02 | Discharge: 2020-07-02 | Disposition: A | Discharge status: Home / Self Care | Payer: BC Managed Care – PPO | Source: Ambulatory Visit | Attending: Otolaryngology | Admitting: Otolaryngology

## 2020-07-02 ENCOUNTER — Other Ambulatory Visit: Payer: Self-pay

## 2020-07-02 DIAGNOSIS — K219 Gastro-esophageal reflux disease without esophagitis: Secondary | ICD-10-CM | POA: Diagnosis not present

## 2020-07-02 DIAGNOSIS — R05 Cough: Secondary | ICD-10-CM | POA: Insufficient documentation

## 2020-07-02 DIAGNOSIS — R059 Cough, unspecified: Secondary | ICD-10-CM

## 2020-07-02 DIAGNOSIS — J301 Allergic rhinitis due to pollen: Secondary | ICD-10-CM | POA: Diagnosis not present

## 2020-07-31 DIAGNOSIS — J305 Allergic rhinitis due to food: Secondary | ICD-10-CM | POA: Diagnosis not present

## 2020-07-31 DIAGNOSIS — J301 Allergic rhinitis due to pollen: Secondary | ICD-10-CM | POA: Diagnosis not present

## 2020-07-31 DIAGNOSIS — K219 Gastro-esophageal reflux disease without esophagitis: Secondary | ICD-10-CM | POA: Diagnosis not present

## 2020-07-31 DIAGNOSIS — R05 Cough: Secondary | ICD-10-CM | POA: Diagnosis not present

## 2020-09-10 DIAGNOSIS — Z01419 Encounter for gynecological examination (general) (routine) without abnormal findings: Secondary | ICD-10-CM | POA: Diagnosis not present

## 2020-09-10 DIAGNOSIS — Z1331 Encounter for screening for depression: Secondary | ICD-10-CM | POA: Diagnosis not present

## 2020-09-10 DIAGNOSIS — Z124 Encounter for screening for malignant neoplasm of cervix: Secondary | ICD-10-CM | POA: Diagnosis not present

## 2020-09-10 DIAGNOSIS — R5383 Other fatigue: Secondary | ICD-10-CM | POA: Diagnosis not present

## 2020-10-01 ENCOUNTER — Ambulatory Visit: Payer: BC Managed Care – PPO | Admitting: Dermatology

## 2020-10-02 ENCOUNTER — Ambulatory Visit: Payer: BC Managed Care – PPO | Admitting: Dermatology

## 2020-10-16 ENCOUNTER — Ambulatory Visit: Payer: BC Managed Care – PPO | Admitting: Dermatology

## 2021-08-14 ENCOUNTER — Other Ambulatory Visit: Payer: Self-pay

## 2021-08-14 ENCOUNTER — Ambulatory Visit (INDEPENDENT_AMBULATORY_CARE_PROVIDER_SITE_OTHER): Payer: BC Managed Care – PPO | Admitting: Dermatology

## 2021-08-14 DIAGNOSIS — L609 Nail disorder, unspecified: Secondary | ICD-10-CM

## 2021-08-14 DIAGNOSIS — D225 Melanocytic nevi of trunk: Secondary | ICD-10-CM

## 2021-08-14 DIAGNOSIS — B351 Tinea unguium: Secondary | ICD-10-CM

## 2021-08-14 DIAGNOSIS — D1800 Hemangioma unspecified site: Secondary | ICD-10-CM

## 2021-08-14 NOTE — Progress Notes (Signed)
   New Patient Visit  Subjective  Lynn Bean is a 35 y.o. female who presents for the following: Nail Problem (Patient here today to have toenails checked. Patient advises that the left great toenail has had white at the nail and sometimes hurts for about 1 year, the right great toe started a few months ago. ) and Nevus (Patient also with nevi at right abdomen that have gotten larger since having a baby 2 years ago. No hx of skin cancer or abnormal moles. ).   The following portions of the chart were reviewed this encounter and updated as appropriate:   Tobacco  Allergies  Meds  Problems  Med Hx  Surg Hx  Fam Hx      Review of Systems:  No other skin or systemic complaints except as noted in HPI or Assessment and Plan.  Objective  Well appearing patient in no apparent distress; mood and affect are within normal limits.  A focused examination was performed including abdomen, feet, toenails. Relevant physical exam findings are noted in the Assessment and Plan.  bilateral great toe nail Lateral onycholysis         Assessment & Plan  Onychomycosis bilateral great toe nail  Favored over trauma  Chronic condition with duration or expected duration over one year. Condition is bothersome to patient. Currently flared.  Discussed po terbinafine vs topicals. Patient defers oral treatment today.   Will send in prescription for [facility] topical nail treatment -Ciclopirox-Itraconazole-Fluconazole-Terbinafine HCl-Ibuprofen-DMSO apply daily to affected nails  Related Procedures Fungus Culture W/Rfx Rapid ID  Hemangiomas - Red papules - Discussed benign nature - Observe - Call for any changes  Melanocytic Nevi - Tan-brown and/or pink-flesh-colored symmetric macules and papules - Benign appearing on exam today - Observation - Call clinic for new or changing moles - Recommend daily use of broad spectrum spf 30+ sunscreen to sun-exposed areas.   Return for 4-5  month recheck nails.  Graciella Belton, RMA, am acting as scribe for Forest Gleason, MD .  Documentation: I have reviewed the above documentation for accuracy and completeness, and I agree with the above.  Forest Gleason, MD

## 2021-08-14 NOTE — Patient Instructions (Signed)
Instructions for [facility] Medications  One or more of your medications was sent to the [facility] mail order compounding pharmacy. You will receive an email from them and can purchase the medicine through that link. It will then be mailed to your home at the address you confirmed. If for any reason you do not receive an email from them, please check your spam folder. If you still do not find the email, please let us know. [facility] phone number is [phone].   If you have any questions or concerns for your doctor, please call our main line at [phone] and press option 4 to reach your doctor's medical assistant. If no one answers, please leave a voicemail as directed and we will return your call as soon as possible. Messages left after 4 pm will be answered the following business day.   You may also send us a message via MyChart. We typically respond to MyChart messages within 1-2 business days.  For prescription refills, please ask your pharmacy to contact our office. Our fax number is [phone].  If you have an urgent issue when the clinic is closed that cannot wait until the next business day, you can page your doctor at the number below.    Please note that while we do our best to be available for urgent issues outside of office hours, we are not available 24/7.   If you have an urgent issue and are unable to reach us, you may choose to seek medical care at your doctor's office, retail clinic, urgent care center, or emergency room.  If you have a medical emergency, please immediately call 911 or go to the emergency department.  Pager Numbers  - Dr. Kowalski: [phone]  - Dr. Moye: [phone]  - Dr. Stewart: [phone]  In the event of inclement weather, please call our main line at [phone] for an update on the status of any delays or closures.  Dermatology Medication Tips: Please keep the boxes that topical medications come in in order to help  keep track of the instructions about where and how to use these. Pharmacies typically print the medication instructions only on the boxes and not directly on the medication tubes.   If your medication is too expensive, please contact our office at [phone] option 4 or send us a message through MyChart.   We are unable to tell what your co-pay for medications will be in advance as this is different depending on your insurance coverage. However, we may be able to find a substitute medication at lower cost or fill out paperwork to get insurance to cover a needed medication.   If a prior authorization is required to get your medication covered by your insurance company, please allow us 1-2 business days to complete this process.  Drug prices often vary depending on where the prescription is filled and some pharmacies may offer cheaper prices.  The website www.goodrx.com contains coupons for medications through different pharmacies. The prices here do not account for what the cost may be with help from insurance (it may be cheaper with your insurance), but the website can give you the price if you did not use any insurance.  - You can print the associated coupon and take it with your prescription to the pharmacy.  - You may also stop by our office during regular business hours and pick up a GoodRx coupon card.  - If you need your prescription sent electronically to a different pharmacy, notify our office   through [facility] MyChart or by phone at [phone] option 4.  

## 2021-08-21 ENCOUNTER — Encounter: Payer: Self-pay | Admitting: Dermatology

## 2021-09-05 LAB — FUNGUS CULTURE W/RFX RAPID ID: Fungal Culture W/Rfx: NEGATIVE

## 2021-09-09 ENCOUNTER — Telehealth: Payer: Self-pay

## 2021-09-09 NOTE — Telephone Encounter (Addendum)
Tried calling patient regarding results and providers recommendations. No answer and mailbox was full. Unable to leave message for patient to return call to office. Will try again at another time.       ----- Message from Alfonso Patten, MD sent at 09/09/2021 12:19 PM EST ----- No fungus identified on nail clipping. This can be because there's truly no fungal infection, or it could be it is infected, but the fungus is just difficult to grow out. If she wants to confirm, we could consider repeat clipping (ideally from nail grown out a little) for H&E to look for fungus under the microscope instead of in culture - sometimes this can show fungus even if we can't grow it in culture.   Also ok to continue the topical medicine for now to see if she thinks there's improvement in the next couple of months. If improving, that would suggest fungus. If no change, would do clipping for H&E to confirm fungus before considering PO therapy (or laser treatment, given cost)  MAs please call. Thank you!

## 2021-09-29 ENCOUNTER — Telehealth: Payer: Self-pay

## 2021-09-29 NOTE — Telephone Encounter (Addendum)
Tried calling patient regarding culture results. No answer. Left message to call office.     ----- Message from Alfonso Patten, MD sent at 09/09/2021 12:19 PM EST ----- No fungus identified on nail clipping. This can be because there's truly no fungal infection, or it could be it is infected, but the fungus is just difficult to grow out. If she wants to confirm, we could consider repeat clipping (ideally from nail grown out a little) for H&E to look for fungus under the microscope instead of in culture - sometimes this can show fungus even if we can't grow it in culture.   Also ok to continue the topical medicine for now to see if she thinks there's improvement in the next couple of months. If improving, that would suggest fungus. If no change, would do clipping for H&E to confirm fungus before considering PO therapy (or laser treatment, given cost)  MAs please call. Thank you!

## 2021-10-01 ENCOUNTER — Encounter: Payer: Self-pay | Admitting: Dermatology

## 2021-10-01 ENCOUNTER — Telehealth: Payer: Self-pay

## 2021-10-01 NOTE — Telephone Encounter (Addendum)
Called and informed patient of results. Patient states she can not tell if topical medication is helping at toenails. She states skin around toenails are starting to peel. She has offered to send pictures through mychart once she has it set up. She also would not be interested in starting pill treatment due to side effects. She is concerned with price of follow up appointment. She is requesting a price quote before making a return follow up appointment.    Routing to provider for response of patient's concerns.     ----- Message from Alfonso Patten, MD sent at 09/09/2021 12:19 PM EST ----- No fungus identified on nail clipping. This can be because there's truly no fungal infection, or it could be it is infected, but the fungus is just difficult to grow out. If she wants to confirm, we could consider repeat clipping (ideally from nail grown out a little) for H&E to look for fungus under the microscope instead of in culture - sometimes this can show fungus even if we can't grow it in culture.   Also ok to continue the topical medicine for now to see if she thinks there's improvement in the next couple of months. If improving, that would suggest fungus. If no change, would do clipping for H&E to confirm fungus before considering PO therapy (or laser treatment, given cost)  MAs please call. Thank you!

## 2021-10-01 NOTE — Telephone Encounter (Signed)
Please advise patient that it can take months to see improvement with topicals, though they only work for about 1 in 5 people. I do not know if we can give her a quote for appt cost as it's through her insurance? It should be whatever the specialty copay for her insurance is. I also could not give her a quote on pathology for the clipping as that's billed through the pathology lab and depends on her insurance. Estill Bamberg, do you know if we could run the pathology as self-pay for $65 with [facility] or is that only for cosmetic/uninsured?   Since she does not want to do pills, I would suggest either continuing the trial treatment with the topical or possibly considering doing a clipping (would require an appointment) to confirm if she would like to stop doing the topical if we could not find any fungus.

## 2021-10-02 NOTE — Telephone Encounter (Signed)
Please let patient know the below re cost to do clipping. It should be whatever her insurance specialty visit copay is plus $65 for pathology, and she has to let us know she wants to do self-pay for pathology to get that price. We don't need to do a clipping unless she wants to since she does not want to do pills. It's reasonable to continue the topicals for a few more months and see if she gets any improvement if she prefers to do that.

## 2021-10-02 NOTE — Telephone Encounter (Signed)
From my understanding with [facility], if the specimen has the [employer] label and marked on the requisition then they get the $65 price, I am not 100% sure.

## 2021-10-08 NOTE — Telephone Encounter (Signed)
Called patient back regarding providers response. Patient states she would like to hold off on nail clippings or additional treatment at this time. She will continue with topical as prescribed. She denied further questions at this time.

## 2021-12-10 ENCOUNTER — Ambulatory Visit: Payer: BC Managed Care – PPO | Admitting: Podiatry

## 2021-12-18 ENCOUNTER — Ambulatory Visit: Payer: BC Managed Care – PPO | Admitting: Dermatology

## 2021-12-24 ENCOUNTER — Ambulatory Visit: Payer: BC Managed Care – PPO | Admitting: Podiatry

## 2022-03-09 ENCOUNTER — Other Ambulatory Visit: Payer: Self-pay | Admitting: Surgery

## 2022-03-09 DIAGNOSIS — R222 Localized swelling, mass and lump, trunk: Secondary | ICD-10-CM

## 2022-03-11 ENCOUNTER — Ambulatory Visit
Admission: RE | Admit: 2022-03-11 | Discharge: 2022-03-11 | Disposition: A | Discharge status: Home / Self Care | Payer: BC Managed Care – PPO | Source: Ambulatory Visit | Attending: Surgery | Admitting: Surgery

## 2022-03-11 DIAGNOSIS — R222 Localized swelling, mass and lump, trunk: Secondary | ICD-10-CM | POA: Insufficient documentation

## 2022-03-16 ENCOUNTER — Other Ambulatory Visit: Payer: Self-pay | Admitting: Surgery

## 2022-03-16 DIAGNOSIS — R222 Localized swelling, mass and lump, trunk: Secondary | ICD-10-CM

## 2022-03-17 ENCOUNTER — Ambulatory Visit: Payer: BC Managed Care – PPO

## 2022-03-18 ENCOUNTER — Ambulatory Visit
Admission: RE | Admit: 2022-03-18 | Discharge: 2022-03-18 | Disposition: A | Discharge status: Home / Self Care | Payer: BC Managed Care – PPO | Source: Ambulatory Visit | Attending: Surgery | Admitting: Surgery

## 2022-03-18 DIAGNOSIS — R222 Localized swelling, mass and lump, trunk: Secondary | ICD-10-CM | POA: Insufficient documentation

## 2022-03-18 MED ORDER — IOHEXOL 300 MG/ML  SOLN
75.0000 mL | Freq: Once | INTRAMUSCULAR | Status: AC | PRN
  Administered 2022-03-18: 75 mL via INTRAVENOUS

## 2022-11-30 DIAGNOSIS — O0992 Supervision of high risk pregnancy, unspecified, second trimester: Secondary | ICD-10-CM | POA: Insufficient documentation

## 2022-12-15 LAB — OB RESULTS CONSOLE HEPATITIS B SURFACE ANTIGEN: Hepatitis B Surface Ag: NEGATIVE

## 2022-12-15 LAB — OB RESULTS CONSOLE RUBELLA ANTIBODY, IGM: Rubella: IMMUNE

## 2022-12-15 LAB — OB RESULTS CONSOLE VARICELLA ZOSTER ANTIBODY, IGG: Varicella: IMMUNE

## 2023-01-27 ENCOUNTER — Other Ambulatory Visit: Payer: Self-pay

## 2023-01-27 ENCOUNTER — Observation Stay
Admission: RE | Admit: 2023-01-27 | Discharge: 2023-01-27 | Disposition: A | Discharge status: Home / Self Care | Payer: BC Managed Care – PPO | Attending: Certified Nurse Midwife | Admitting: Certified Nurse Midwife

## 2023-01-27 ENCOUNTER — Observation Stay: Payer: BC Managed Care – PPO

## 2023-01-27 ENCOUNTER — Encounter: Payer: Self-pay | Admitting: Obstetrics and Gynecology

## 2023-01-27 DIAGNOSIS — W01198A Fall on same level from slipping, tripping and stumbling with subsequent striking against other object, initial encounter: Secondary | ICD-10-CM | POA: Insufficient documentation

## 2023-01-27 DIAGNOSIS — O09522 Supervision of elderly multigravida, second trimester: Secondary | ICD-10-CM | POA: Diagnosis present

## 2023-01-27 DIAGNOSIS — Z79899 Other long term (current) drug therapy: Secondary | ICD-10-CM | POA: Diagnosis not present

## 2023-01-27 DIAGNOSIS — Z3A20 20 weeks gestation of pregnancy: Secondary | ICD-10-CM | POA: Diagnosis not present

## 2023-01-27 DIAGNOSIS — W19XXXA Unspecified fall, initial encounter: Secondary | ICD-10-CM | POA: Diagnosis present

## 2023-01-27 MED ORDER — CALCIUM CARBONATE ANTACID 500 MG PO CHEW: 2.0000 | CHEWABLE_TABLET | ORAL | Status: DC | PRN

## 2023-01-27 MED ORDER — PRENATAL MULTIVITAMIN CH: 1.0000 | ORAL_TABLET | Freq: Every day | ORAL | Status: DC

## 2023-01-27 MED ORDER — DOCUSATE SODIUM 100 MG PO CAPS: 100.0000 mg | ORAL_CAPSULE | Freq: Every day | ORAL | Status: DC

## 2023-01-27 MED ORDER — LACTATED RINGERS IV SOLN: 125.0000 mL/h | INTRAVENOUS | Status: DC

## 2023-01-27 MED ORDER — ZOLPIDEM TARTRATE 5 MG PO TABS: 5.0000 mg | ORAL_TABLET | Freq: Every evening | ORAL | Status: DC | PRN

## 2023-01-27 MED ORDER — ACETAMINOPHEN 325 MG PO TABS: 650.0000 mg | ORAL_TABLET | ORAL | Status: DC | PRN

## 2023-01-27 NOTE — OB Triage Note (Signed)
Pt is a 37yo G2P1, 20w 4d. She arrived to the unit sent by [facility] for monitoring from a fall this morning. She states that she fell around 0830 when she was getting out of her [place] and landed on her left buttocks.  Abdomen soft and non-tender.  She denies pain, vaginal bleeding/discharge, reports positive fetal movement, and reports no contractions.  VS stable, toco applied and assessing.   Initial FHT 152 at 1108 with the doppler. CNM aware of arrival.

## 2023-01-27 NOTE — Progress Notes (Signed)
Discharge instructions provided to pt. Pt verbalizes understanding. Vaginal bleeding and discharge, contractions, and fetal movement reviewed by RN. Follow-up care reviewed. Pt discharged home by self - stable and ambulatory.

## 2023-01-27 NOTE — Discharge Summary (Signed)
CLIFFIE BOWHAY is a 37 y.o. female. She is at [redacted]w[redacted]d gestation. No LMP recorded. Patient is pregnant. Estimated Date of Delivery: 06/12/23  Prenatal care site: [facility]   Current pregnancy complicated by:  - history of pre-eclampsia - history of cesarean section - history of chronic HTN (resolved around 6 months postpartum) - subchorionic hemorrhage (resolved) - advanced maternal age  Chief complaint: fall onto her buttocks  She reports this morning when she got to work around [employer], she put her foot out of her truck and slipped on something on the ground. She tried to catch herself on the truck but hit her arm and fell, landing on her buttocks on the asphalt. She reports good fetal movement since the incident. She denies leaking fluid, bleeding, or cramping. She has been able to ambulate without difficulty since the fall.   S: Resting comfortably. no CTX, no VB.no LOF,  Active fetal movement. Denies: HA, visual changes, SOB, or RUQ/epigastric pain  Maternal Medical History:   Past Medical History:  Diagnosis Date   Acne    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    Completed See Immunization list   Pregnancy induced hypertension    Ruptured disc, thoracic 2007    Past Surgical History:  Procedure Laterality Date   CESAREAN SECTION N/A 06/29/2019   Procedure: CESAREAN SECTION;  Surgeon: Ouida Sills, Gwen Her, MD;  Location: [facility] ORS;  Service: Obstetrics;  Laterality: N/A;   NO PAST SURGERIES      Allergies  Allergen Reactions   Benadryl [Diphenhydramine]     Only brand name benadryl    Prior to Admission medications   Medication Sig Start Date End Date Taking? Authorizing Provider  acetaminophen (TYLENOL) 500 MG tablet Take 2 tablets (1,000 mg total) by mouth 4 (four) times daily. 07/02/19  Yes McVey, Murray Hodgkins, CNM  ascorbic acid (VITAMIN C) 500 MG tablet Take 500 mg by mouth daily.   Yes [provider]  cholecalciferol (VITAMIN  D3) 25 MCG (1000 UNIT) tablet Take 1,000 Units by mouth daily.   Yes [provider]  magnesium (MAGTAB) 84 MG (7MEQ) TBCR SR tablet Take 200 mg by mouth daily.   Yes [provider]  Prenatal Vit-Fe Fumarate-FA (PRENATAL MULTIVITAMIN) TABS tablet Take 1 tablet by mouth daily at 12 noon.   Yes [provider]    Social History: She  reports that she has never smoked. She has never used smokeless tobacco. She reports that she does not currently use alcohol. She reports that she does not use drugs.  Family History: family history includes Hypertension in her father; Lymphoma in her paternal grandfather; Prostate cancer in her paternal grandfather.  no history of gyn cancers  Review of Systems: A full review of systems was performed and negative except as noted in the HPI.     O:  BP 130/77 (BP Location: Left Arm)   Pulse 97   Temp 98.2 F (36.8 C) (Oral)   Resp 18   Ht 5\' 6"  (1.676 m)   Wt 93 kg   BMI 33.09 kg/m  No results found for this or any previous visit (from the past 48 hour(s)).   Constitutional: NAD, AAOx3  HE/ENT: extraocular movements grossly intact, moist mucous membranes CV: RRR PULM: nl respiratory effort, CTABL     Abd: gravid, non-tender, non-distended, soft      Ext: Non-tender, Nonedematous   Psych: mood appropriate, speech normal Pelvic: deferred  Fetal  monitoring: FHT 145bpm,  reassuring before and after monitoring.  Contraction monitoring: no contractions x 4 hours  OB US: Normal appearing placenta, normal appearing AFI  A/P: 37 y.o. [redacted]w[redacted]d here for antenatal surveillance for fall in second trimester  Principle Diagnosis:  fall in second trimester  Placenta abruption: not present, normal [place], no evidence of abruption. Reviewed warning signs of cramping, bleeding, or decreased fetal movement. Labor: not present.  Fetal Wellbeing: Reassuring  D/c home stable, precautions reviewed, follow-up as scheduled.    Gertie Fey, CNM 01/27/2023 3:07 PM

## 2023-03-06 IMAGING — CT CT CHEST W/ CM
2 of 4 series · 15 of 36 positions shown, 18 images · IV contrast (agent unspecified)
Comparison: 07/04/2019 chest CT

CLINICAL DATA: Golf ball size mass that came up in the center of
chest at the xiphoid bone with soreness.

EXAM:
CT CHEST WITH CONTRAST
TECHNIQUE: Multidetector CT imaging of the chest was performed during
intravenous contrast administration.

[Series 2: axial chest 2.00 · axial · 0.65mm/px · z∈[-1211,-913]mm · 12 of 177 slices shown, 15 images]
[im 14/177  mediastinal]
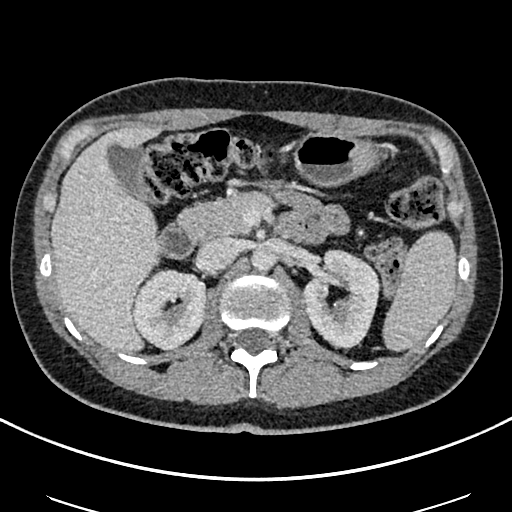
[im 14/177  lung]
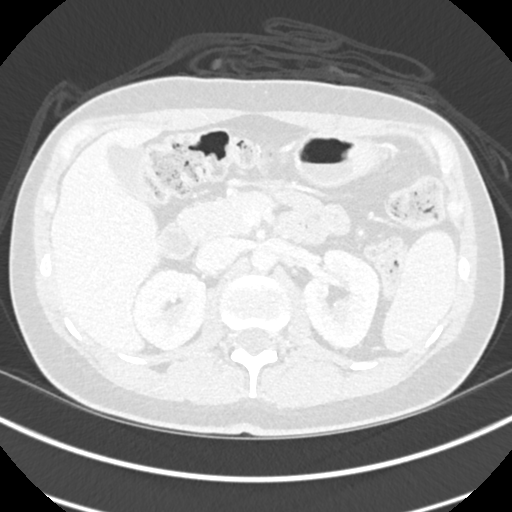
[im 28/177  lung]
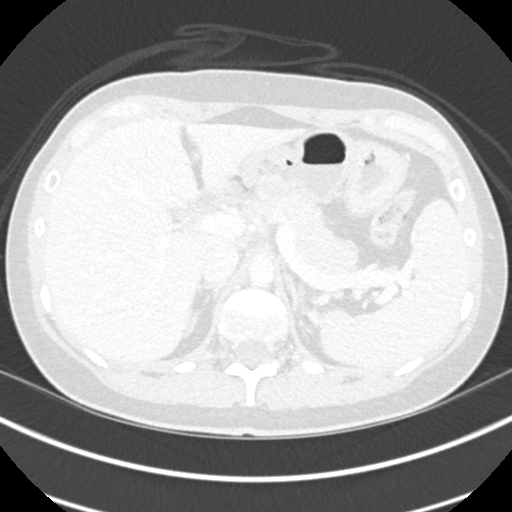
[im 41/177  lung]
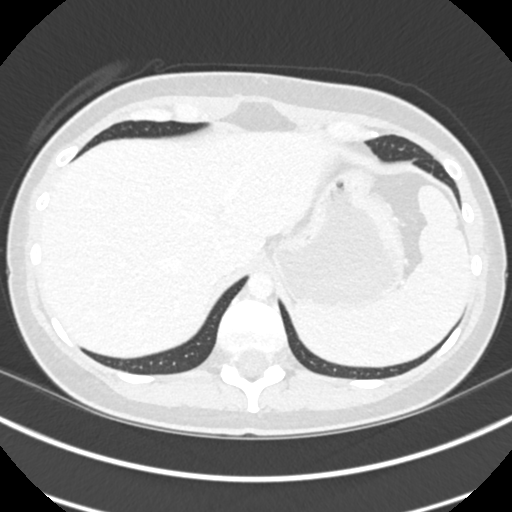
[im 55/177  lung]
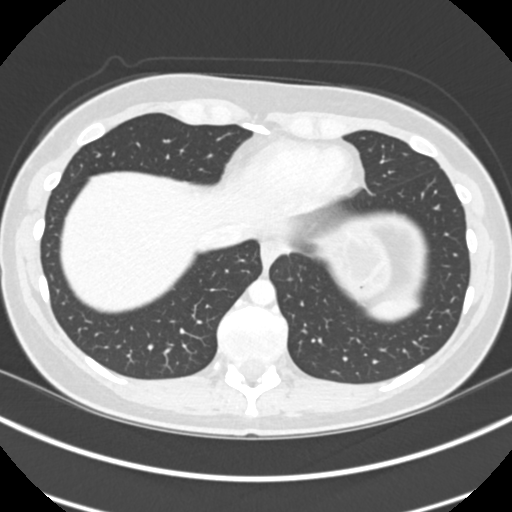
[im 68/177  mediastinal]
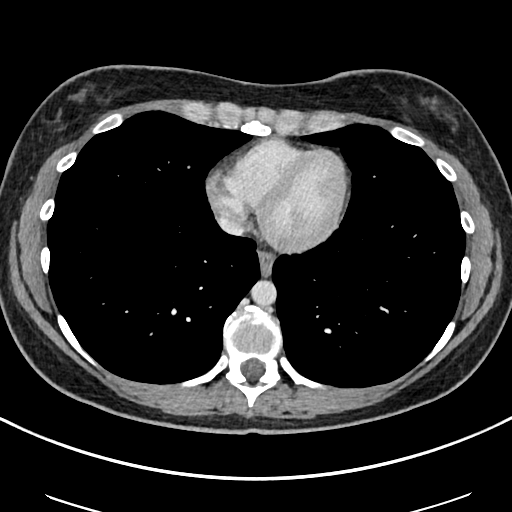
[im 68/177  lung]
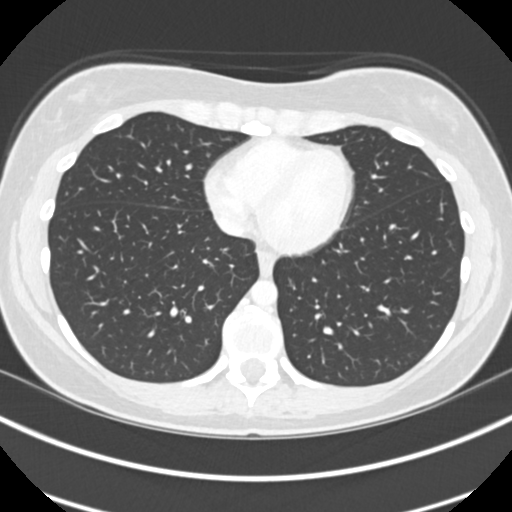
[im 82/177  lung]
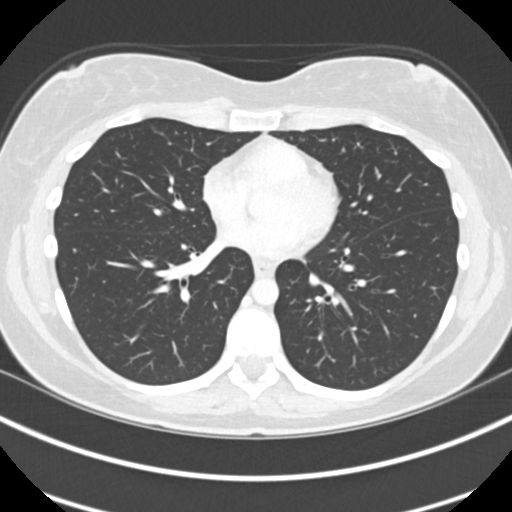
[im 95/177  lung]
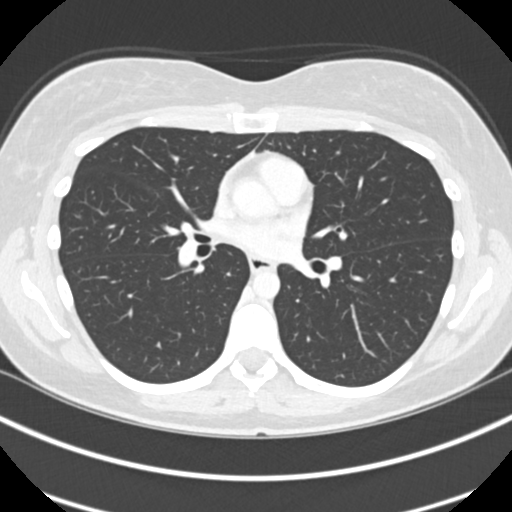
[im 109/177  lung]
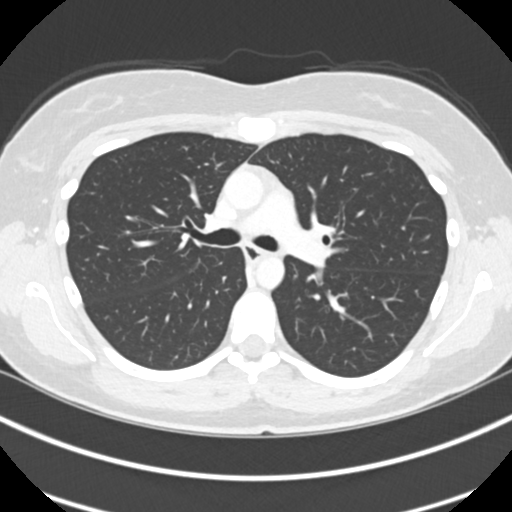
[im 122/177  mediastinal]
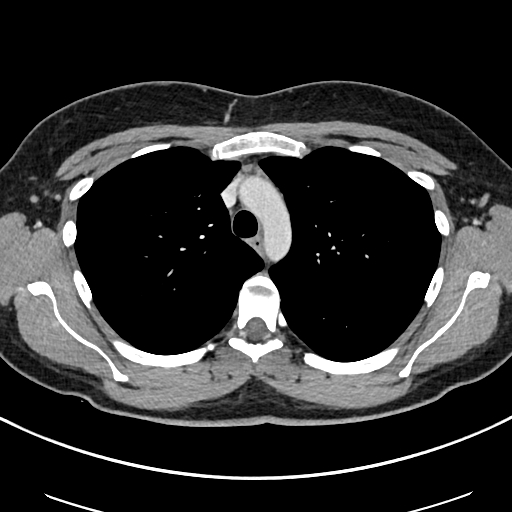
[im 122/177  lung]
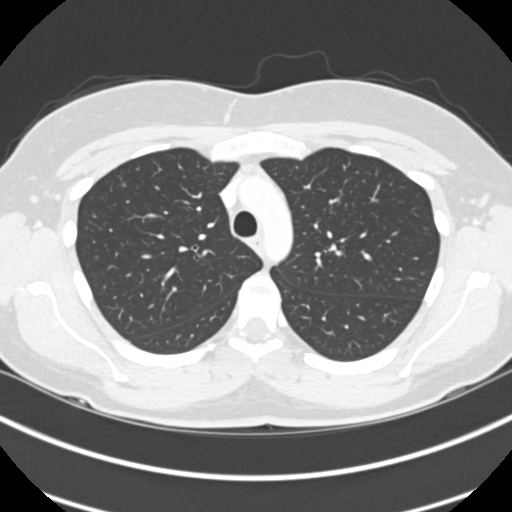
[im 136/177  lung]
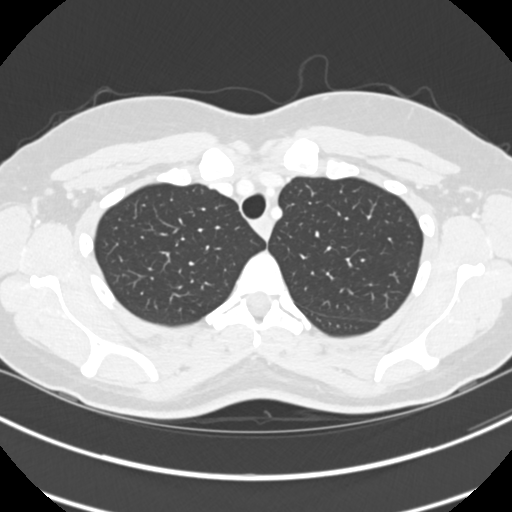
[im 149/177  lung]
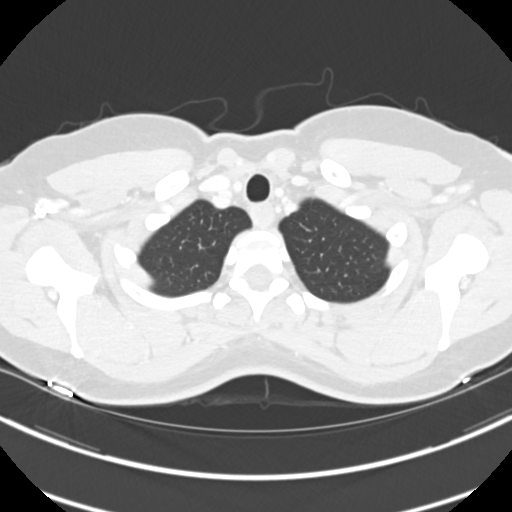
[im 163/177  lung]
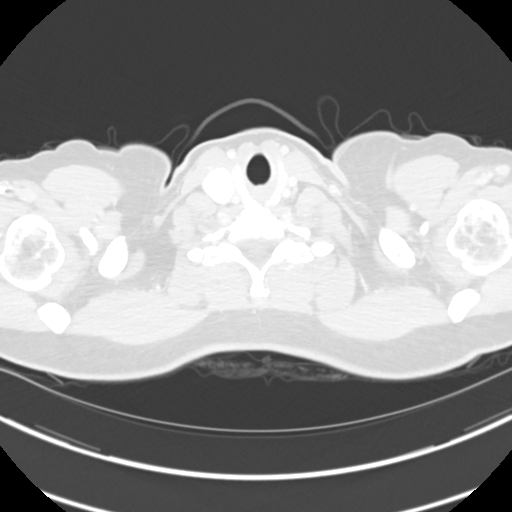

[Series 4: coronal chest 2.00 cor · coronal · 0.65mm/px · 3 of 165 slices shown]
[im 33/165  lung]
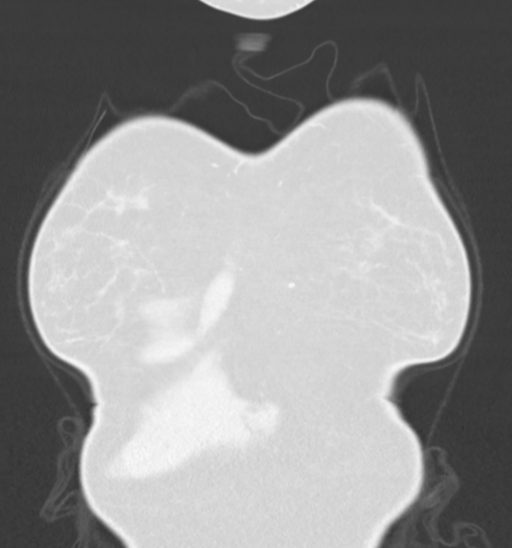
[im 66/165  lung]
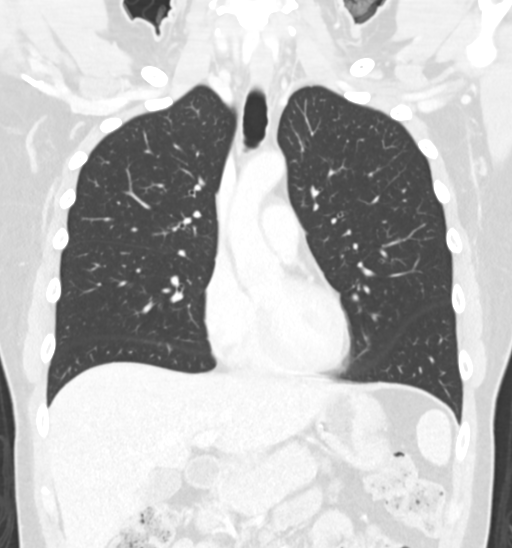
[im 99/165  lung]
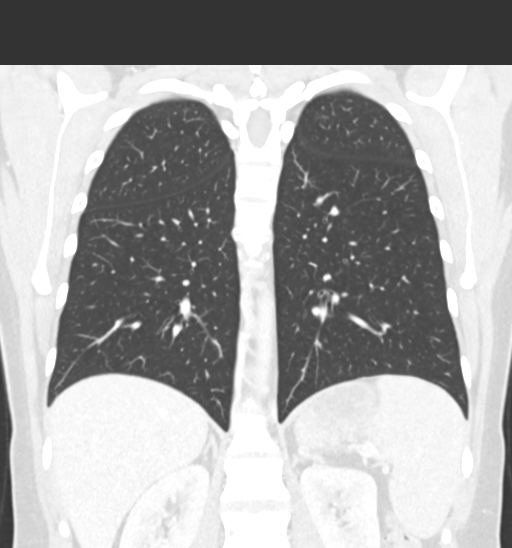

[15 of 36 positions shown; findings below may reference images not displayed]

RADIATION DOSE REDUCTION: This exam was performed according to the
departmental dose-optimization program which includes automated
exposure control, adjustment of the mA and/or kV according to
patient size and/or use of iterative reconstruction technique.

CONTRAST:  75mL OMNIPAQUE IOHEXOL 300 MG/ML  SOLN
FINDINGS: Cardiovascular: No significant vascular findings. Normal heart size.
No pericardial effusion.

Mediastinum/Nodes: Negative for mass or adenopathy.

Lungs/Pleura: Central airways are clear. There is no edema,
consolidation, effusion, or pneumothorax.

Upper Abdomen: Unremarkable

Musculoskeletal: Negative for mass in the chest wall. The tip of the
xiphoid projects outward, stable from 9797.
IMPRESSION: Negative for mass. Outward protrusion of the xiphoid process may
relate to the palpable complaint, developmental and stable from

## 2023-03-08 DIAGNOSIS — O99210 Obesity complicating pregnancy, unspecified trimester: Secondary | ICD-10-CM | POA: Insufficient documentation

## 2023-04-28 ENCOUNTER — Other Ambulatory Visit: Payer: Self-pay

## 2023-04-28 ENCOUNTER — Other Ambulatory Visit: Payer: Self-pay | Admitting: Certified Nurse Midwife

## 2023-04-28 DIAGNOSIS — O3663X1 Maternal care for excessive fetal growth, third trimester, fetus 1: Secondary | ICD-10-CM

## 2023-05-04 DIAGNOSIS — O3660X0 Maternal care for excessive fetal growth, unspecified trimester, not applicable or unspecified: Secondary | ICD-10-CM | POA: Insufficient documentation

## 2023-05-04 DIAGNOSIS — O34219 Maternal care for unspecified type scar from previous cesarean delivery: Secondary | ICD-10-CM | POA: Insufficient documentation

## 2023-05-04 DIAGNOSIS — O09293 Supervision of pregnancy with other poor reproductive or obstetric history, third trimester: Secondary | ICD-10-CM | POA: Insufficient documentation

## 2023-05-04 DIAGNOSIS — O09529 Supervision of elderly multigravida, unspecified trimester: Secondary | ICD-10-CM | POA: Insufficient documentation

## 2023-05-10 ENCOUNTER — Encounter: Payer: Self-pay | Admitting: *Deleted

## 2023-05-10 ENCOUNTER — Encounter: Payer: Self-pay | Admitting: Obstetrics and Gynecology

## 2023-05-10 ENCOUNTER — Ambulatory Visit: Payer: BC Managed Care – PPO | Admitting: *Deleted

## 2023-05-10 ENCOUNTER — Ambulatory Visit (HOSPITAL_BASED_OUTPATIENT_CLINIC_OR_DEPARTMENT_OTHER): Payer: BC Managed Care – PPO | Admitting: Maternal & Fetal Medicine

## 2023-05-10 ENCOUNTER — Observation Stay
Admission: AD | Admit: 2023-05-10 | Discharge: 2023-05-10 | Disposition: A | Discharge status: Home / Self Care | Payer: BC Managed Care – PPO | Source: Ambulatory Visit

## 2023-05-10 ENCOUNTER — Other Ambulatory Visit: Payer: Self-pay

## 2023-05-10 ENCOUNTER — Ambulatory Visit: Payer: BC Managed Care – PPO | Attending: Certified Nurse Midwife

## 2023-05-10 ENCOUNTER — Other Ambulatory Visit: Payer: Self-pay | Admitting: Certified Nurse Midwife

## 2023-05-10 VITALS — BP 144/78 | HR 109

## 2023-05-10 DIAGNOSIS — O09293 Supervision of pregnancy with other poor reproductive or obstetric history, third trimester: Secondary | ICD-10-CM | POA: Diagnosis not present

## 2023-05-10 DIAGNOSIS — O09523 Supervision of elderly multigravida, third trimester: Secondary | ICD-10-CM

## 2023-05-10 DIAGNOSIS — O34219 Maternal care for unspecified type scar from previous cesarean delivery: Secondary | ICD-10-CM | POA: Diagnosis present

## 2023-05-10 DIAGNOSIS — Z3A35 35 weeks gestation of pregnancy: Secondary | ICD-10-CM

## 2023-05-10 DIAGNOSIS — Z7982 Long term (current) use of aspirin: Secondary | ICD-10-CM | POA: Insufficient documentation

## 2023-05-10 DIAGNOSIS — O09529 Supervision of elderly multigravida, unspecified trimester: Secondary | ICD-10-CM

## 2023-05-10 DIAGNOSIS — O3663X1 Maternal care for excessive fetal growth, third trimester, fetus 1: Secondary | ICD-10-CM

## 2023-05-10 DIAGNOSIS — O163 Unspecified maternal hypertension, third trimester: Secondary | ICD-10-CM | POA: Insufficient documentation

## 2023-05-10 DIAGNOSIS — Z79899 Other long term (current) drug therapy: Secondary | ICD-10-CM | POA: Insufficient documentation

## 2023-05-10 DIAGNOSIS — O09213 Supervision of pregnancy with history of pre-term labor, third trimester: Secondary | ICD-10-CM

## 2023-05-10 DIAGNOSIS — O3663X0 Maternal care for excessive fetal growth, third trimester, not applicable or unspecified: Secondary | ICD-10-CM

## 2023-05-10 DIAGNOSIS — O3660X0 Maternal care for excessive fetal growth, unspecified trimester, not applicable or unspecified: Secondary | ICD-10-CM

## 2023-05-10 DIAGNOSIS — O133 Gestational [pregnancy-induced] hypertension without significant proteinuria, third trimester: Secondary | ICD-10-CM

## 2023-05-10 DIAGNOSIS — O139 Gestational [pregnancy-induced] hypertension without significant proteinuria, unspecified trimester: Secondary | ICD-10-CM | POA: Diagnosis present

## 2023-05-10 LAB — CBC
HCT: 36.3 % (ref 36.0–46.0)
Hemoglobin: 11.8 g/dL — ABNORMAL LOW (ref 12.0–15.0)
MCH: 26.8 pg (ref 26.0–34.0)
MCHC: 32.5 g/dL (ref 30.0–36.0)
MCV: 82.3 fL (ref 80.0–100.0)
Platelets: 254 10*3/uL (ref 150–400)
RBC: 4.41 MIL/uL (ref 3.87–5.11)
RDW: 13.9 % (ref 11.5–15.5)
WBC: 14.2 10*3/uL — ABNORMAL HIGH (ref 4.0–10.5)
nRBC: 0.1 % (ref 0.0–0.2)

## 2023-05-10 LAB — PROTEIN / CREATININE RATIO, URINE
Creatinine, Urine: 31 mg/dL
Total Protein, Urine: <6 mg/dL

## 2023-05-10 LAB — COMPREHENSIVE METABOLIC PANEL
ALT: 23 U/L (ref 0–44)
AST: 22 U/L (ref 15–41)
Albumin: 2.7 g/dL — ABNORMAL LOW (ref 3.5–5.0)
Alkaline Phosphatase: 95 U/L (ref 38–126)
Anion gap: 9 (ref 5–15)
BUN: 9 mg/dL (ref 6–20)
CO2: 21 mmol/L — ABNORMAL LOW (ref 22–32)
Calcium: 8.8 mg/dL — ABNORMAL LOW (ref 8.9–10.3)
Chloride: 105 mmol/L (ref 98–111)
Creatinine, Ser: 0.91 mg/dL (ref 0.44–1.00)
GFR, Estimated: >60 mL/min (ref >60)
Glucose, Bld: 93 mg/dL (ref 70–99)
Potassium: 3.7 mmol/L (ref 3.5–5.1)
Sodium: 135 mmol/L (ref 135–145)
Total Bilirubin: 0.4 mg/dL (ref 0.3–1.2)
Total Protein: 5.8 g/dL — ABNORMAL LOW (ref 6.5–8.1)

## 2023-05-10 MED ORDER — CALCIUM CARBONATE ANTACID 500 MG PO CHEW: 2.0000 | CHEWABLE_TABLET | ORAL | Status: DC | PRN

## 2023-05-10 MED ORDER — ACETAMINOPHEN 500 MG PO TABS: 1000.0000 mg | ORAL_TABLET | Freq: Four times a day (QID) | ORAL | Status: DC | PRN

## 2023-05-10 NOTE — Discharge Instructions (Addendum)
Induction of labor for [facility] at [facility]   Induction of labor scheduled for 05/18/2023 at 5:00 am   You have been scheduled for induction of labor on 05/18/2023 at 5:00 am.  Please call Labor and Delivery at [phone] an hour before your induction time (05/18/23 at 4AM) to make sure that they have bed availability for you.  Please be patient during this time as we cannot control how many babies want to have a birthday at the same time.     This information has been sent to your prenatal provider.  They can go over common induction methods and help answer questions.  You can also call L&D with questions prior to your induction.     For blood pressure monitoring at home: Please check your blood pressure at home daily and write down what it is so that you can review it with your provider  Make sure you are able to sit for 15 minutes prior to taking it  If blood pressure 160/110 or greater repeat in 15 minutes.  If still 160/110 or greater come to labor and delivery for evaluation.  If it goes down please contact the on call provider to check in.   Please go to labor and delivery for severe range blood pressures (160/110 or higher), moderate to severe headache that is not relieved with Tylenol, chest pain, shortness of breath, changes in vision, or persistent pain in upper right abdomen, vaginal bleeding or spotting, or decreased fetal movement.

## 2023-05-10 NOTE — OB Triage Note (Signed)
Pt discharged home stable and ambulatory w/ labs showing GHTN and not PE. Discharge instructions reviewed w pt and pt verbalized understanding. Symptoms of hypertension and PE were reviewed. Pt has an induction date and time scheduled, reviewed w pt to come in ED entrance and to attend her OB appt tomorrow.

## 2023-05-10 NOTE — Progress Notes (Signed)
   Patient information  Patient Name: Lynn Bean  Patient MRN:   [id number]  Referring practice: [facility] Referring Provider: [facility]  MFM CONSULT  Lynn Bean is a 37 y.o. G2P0101 at [redacted]w[redacted]d here for ultrasound and consultation.   The patient was referred here for a large for gestational age fetus.  The fetal weight is at the greater than the 99th percentile.  I discussed the concern for fetal macrosomia.  Notably her blood pressure was elevated today at 142/87.  And 144/78.  I discussed my concern for potential preeclampsia especially given her history.  She reports that she feels fine and denies any headaches, vision changes right upper quadrant pain.  She did verbalize that she preferred to go to [facility] to be assessed since she lives in that area.  Since she has been asymptomatic I instructed her that it is reasonable to drive to [facility] for assessment at L&D.  Sonographic findings Single intrauterine pregnancy at 35w 6d. Fetal cardiac activity:  Observed and appears normal. Presentation: Cephalic. The anatomic structures that were well seen appear normal without evidence of soft markers. Due to poor acoustic windows some structures remain suboptimally visualized. Fetal biometry shows the estimated fetal weight at the > 99 percentile and the abdominal circumference at the >99th percentile.  Amniotic fluid:  MVP: 7.84 cm. Placenta: Anterior. Adnexa:No masses visualized.  BPP 8/8.   Assessment Elevated blood pressure affecting pregnancy in third trimester, antepartum  Hx of preeclampsia, prior pregnancy, currently pregnant, third trimester  Multigravida of advanced maternal age in third trimester  Previous cesarean delivery, antepartum  Excessive fetal growth affecting management of pregnancy, antepartum, single or unspecified fetus  Plan -Sent to labor and delivery for preeclampsia rule out.  If she is discharged she will need weekly  outpatient labs (CBC, CMP), antenatal testing and BP check  -If her blood pressure is elevated again she would rule in for gestational hypertension and need to be delivered at 37 weeks via repeat C-section.  Review of Systems: A review of systems was performed and was negative except per HPI   Vitals and Physical Exam    05/10/2023    4:26 PM 05/10/2023    4:11 PM 05/10/2023    3:55 PM  Vitals with BMI  Systolic 137 130 253  Diastolic 87 66 81  Pulse 102 99 98  sitting comfortably on the sonogram table Nonlabored breathing Normal rate and rhythm Abdomen is nontender  Past pregnancies OB History  Gravida Para Term Preterm AB Living  2 1 0 1 0 1  SAB IAB Ectopic Multiple Live Births  0 0 0 0 1    # Outcome Date GA Lbr Len/2nd Weight Sex Delivery Anes PTL Lv  2 Current           1 Preterm 06/29/19 [redacted]w[redacted]d  7 lb 0.2 oz (3.18 kg) F CS-LTranv EPI  LIV      I spent 45 minutes reviewing the patients chart, including labs and images as well as counseling the patient about her medical conditions. Greater than 50% of the time was spent in direct face-to-face patient counseling.  Braxton Feathers  MFM, [facility]   05/10/2023  5:34 PM

## 2023-05-10 NOTE — Discharge Summary (Addendum)
Lynn Bean is a 37 y.o. female. She is at [redacted]w[redacted]d gestation. Patient's last menstrual period was 09/01/2022. 06/08/2023, by Last Menstrual Period   Prenatal care site: [facility]  Chief complaint: elevated blood pressures   HPI: Lynn Bean presents to L&D with complaints of elevated blood pressures during her MFM appointment today.  Her pregnancy is currently complicated by macrosomia, AMA, obesity, previous cesarean delivery, and history of preeclampsia with severe features in her previous pregnancy. She is seen by MFM for macrosomia.  BPP was 8/8, AFI 22cm, EFW 3958 grams (>99%) today.  BP was mildly elevated.  She was sent to L&D triage for preeclampsia evaluation. She denies headache, changes in vision, or RUQ pain.  Denies contractions, vaginal bleeding, or LOF.  Endorses good fetal movement.   Factors complicating pregnancy: Macrosomia AMA Obesity  Previous cesarean delivery  History of preeclampsia with severe features   S: Resting comfortably. no CTX, no VB.no LOF,  Active fetal movement. Denies HA, changes in vision, or RUQ pain.   Maternal Medical History:  Past Medical Hx:  has a past medical history of Acne, 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, Pregnancy induced hypertension, and Ruptured disc, thoracic (2007).    Past Surgical Hx:  has a past surgical history that includes Cesarean section (N/A, 06/29/2019).   Allergies  Allergen Reactions   Benadryl [Diphenhydramine]     Only brand name benadryl     Prior to Admission medications   Medication Sig Start Date End Date Taking? Authorizing Provider  acetaminophen (TYLENOL) 500 MG tablet Take 2 tablets (1,000 mg total) by mouth 4 (four) times daily. 07/02/19  Yes McVey, Prudencio Pair, CNM  ascorbic acid (VITAMIN C) 500 MG tablet Take 500 mg by mouth daily.   Yes [provider]  aspirin EC 81 MG tablet Take 81 mg by mouth daily. Swallow whole.   Yes [provider]   cholecalciferol (VITAMIN D3) 25 MCG (1000 UNIT) tablet Take 1,000 Units by mouth daily.   Yes [provider]  magnesium (MAGTAB) 84 MG ( ) TBCR SR tablet Take 200 mg by mouth daily.   Yes [provider]  Prenatal Vit-Fe Fumarate-FA (PRENATAL MULTIVITAMIN) TABS tablet Take 1 tablet by mouth daily at 12 noon.   Yes [provider]    Social History: She  reports that she has never smoked. She has never used smokeless tobacco. She reports that she does not currently use alcohol. She reports that she does not use drugs.  Family History: family history includes Hypertension in her father; Lymphoma in her paternal grandfather; Prostate cancer in her paternal grandfather.   Review of Systems: A full review of systems was performed and negative except as noted in the HPI.     Pertinent Results:   O:  BP 137/87   Pulse (!) 102   Temp 98 F (36.7 C) (Oral)   Resp 18   Ht 5\' 6"  (1.676 m)   Wt 104.3 kg   LMP 09/01/2022   BMI 37.12 kg/m  Results for orders placed or performed during the hospital encounter of 05/10/23 (from the past 48 hour(s))  Protein / creatinine ratio, urine   Collection Time: 05/10/23  3:50 PM  Result Value Ref Range   Creatinine, Urine 31 mg/dL   Total Protein, Urine <6 mg/dL   Protein Creatinine Ratio        0.00 - 0.15 mg/mg[Cre]  Comprehensive metabolic panel   Collection Time: 05/10/23  4:02 PM  Result Value Ref Range   Sodium 135 135 - 145 mmol/L   Potassium 3.7 3.5 - 5.1 mmol/L   Chloride 105 98 - 111 mmol/L   CO2 21 (L) 22 - 32 mmol/L   Glucose, Bld 93 70 - 99 mg/dL   BUN 9 6 - 20 mg/dL   Creatinine, Ser 8.29 0.44 - 1.00 mg/dL   Calcium 8.8 (L) 8.9 - 10.3 mg/dL   Total Protein 5.8 (L) 6.5 - 8.1 g/dL   Albumin 2.7 (L) 3.5 - 5.0 g/dL   AST 22 15 - 41 U/L   ALT 23 0 - 44 U/L   Alkaline Phosphatase 95 38 - 126 U/L   Total Bilirubin 0.4 0.3 - 1.2 mg/dL   GFR, Estimated >56 >21 mL/min   Anion gap 9 5 - 15  CBC    Collection Time: 05/10/23  4:02 PM  Result Value Ref Range   WBC 14.2 (H) 4.0 - 10.5 K/uL   RBC 4.41 3.87 - 5.11 MIL/uL   Hemoglobin 11.8 (L) 12.0 - 15.0 g/dL   HCT 30.8 65.7 - 84.6 %   MCV 82.3 80.0 - 100.0 fL   MCH 26.8 26.0 - 34.0 pg   MCHC 32.5 30.0 - 36.0 g/dL   RDW 96.2 95.2 - 84.1 %   Platelets 254 150 - 400 K/uL   nRBC 0.1 0.0 - 0.2 %     Constitutional: NAD, AAOx3  PULM: nl respiratory effort Abd: gravid, non-tender, non-distended, soft  Ext: Non-tender, Nonedmeatous Psych: mood appropriate, speech normal Pelvic : deferred  NST: Baseline FHR: 135 beats/min Variability: moderate Accelerations: present Decelerations: absent Tocometry: None Time: at least 20 minutes   Interpretation: Category I INDICATIONS: gestational hypertension RESULTS:  A NST procedure was performed with FHR monitoring and a normal baseline established, appropriate time of 20-40 minutes of evaluation, and accels >2 seen w 15x15 characteristics.  Results show a REACTIVE NST.   Assessment: 37 y.o. L2G4010 [redacted]w[redacted]d 06/08/2023, by Last Menstrual Period   Principle diagnosis: Elevated blood pressure affecting pregnancy in third trimester, antepartum [O16.3]; Gestational hypertension   Plan: 1) Reactive NST  -Category 1 tracing  -Reassuring fetal status   2) Gestational hypertension  -Preeclampsia labs WNL  -Blood pressures mild range, asymptomatic  -Discussed warning signs to return to L&D triage with  -Reviewed comfort measures   3) Disposition: discharge home stable -Precautions reviewed  -Discussed TOLAC versus repeat c/s.  She strongly desires a TOLAC.  She not unopposed to a cesarean delivery if it's needed but desires to attempt an induction.  Will discuss with Dr. Feliberto Gottron during her appointment tomorrow.  -IOL scheduled for 05/18/2023 at 5:00 am  -Follow up as scheduled tomorrow for blood pressure check and routine prenatal visit    ----- Lynn Bean, CNM Certified Nurse  Midwife [place]  [facility]

## 2023-05-10 NOTE — OB Triage Note (Signed)
37 y.o G2P1 presents in L&D triage at [redacted]w[redacted]d for a PIH evaluation. She saw MFM today for large gestational age and had elevated BP's in the 140's over 80's. Pt has a h/o PE with her first pregnancy. She denies headache, epigastric pain, abnormal swelling, LOF, vaginal bleeding, and ctx's. Monitors applied and asessing w reactive NST. Mackie CNM notified of pt arrival and orders are placed for labwork.

## 2023-05-11 LAB — OB RESULTS CONSOLE GC/CHLAMYDIA
Chlamydia: NEGATIVE
Neisseria Gonorrhea: NEGATIVE

## 2023-05-11 LAB — OB RESULTS CONSOLE GBS: GBS: NEGATIVE

## 2023-05-12 LAB — OB RESULTS CONSOLE RPR: RPR: NONREACTIVE

## 2023-05-12 LAB — OB RESULTS CONSOLE HIV ANTIBODY (ROUTINE TESTING): HIV: NONREACTIVE

## 2023-05-16 ENCOUNTER — Encounter: Payer: Self-pay | Admitting: Obstetrics and Gynecology

## 2023-05-16 ENCOUNTER — Inpatient Hospital Stay
Admission: EM | Admit: 2023-05-16 | Discharge: 2023-05-21 | DRG: 788 | Disposition: A | Discharge status: Home / Self Care | Payer: BC Managed Care – PPO | Attending: Obstetrics | Admitting: Obstetrics

## 2023-05-16 ENCOUNTER — Other Ambulatory Visit: Payer: Self-pay

## 2023-05-16 DIAGNOSIS — Z8279 Family history of other congenital malformations, deformations and chromosomal abnormalities: Secondary | ICD-10-CM

## 2023-05-16 DIAGNOSIS — O139 Gestational [pregnancy-induced] hypertension without significant proteinuria, unspecified trimester: Secondary | ICD-10-CM | POA: Diagnosis present

## 2023-05-16 DIAGNOSIS — O3663X0 Maternal care for excessive fetal growth, third trimester, not applicable or unspecified: Secondary | ICD-10-CM | POA: Diagnosis present

## 2023-05-16 DIAGNOSIS — O99214 Obesity complicating childbirth: Secondary | ICD-10-CM | POA: Diagnosis present

## 2023-05-16 DIAGNOSIS — Z7982 Long term (current) use of aspirin: Secondary | ICD-10-CM

## 2023-05-16 DIAGNOSIS — O133 Gestational [pregnancy-induced] hypertension without significant proteinuria, third trimester: Principal | ICD-10-CM

## 2023-05-16 DIAGNOSIS — O134 Gestational [pregnancy-induced] hypertension without significant proteinuria, complicating childbirth: Principal | ICD-10-CM | POA: Diagnosis present

## 2023-05-16 DIAGNOSIS — O34211 Maternal care for low transverse scar from previous cesarean delivery: Secondary | ICD-10-CM | POA: Diagnosis present

## 2023-05-16 DIAGNOSIS — O339 Maternal care for disproportion, unspecified: Secondary | ICD-10-CM | POA: Diagnosis present

## 2023-05-16 DIAGNOSIS — Z8249 Family history of ischemic heart disease and other diseases of the circulatory system: Secondary | ICD-10-CM

## 2023-05-16 DIAGNOSIS — Z3A37 37 weeks gestation of pregnancy: Secondary | ICD-10-CM

## 2023-05-16 LAB — CBC WITH DIFFERENTIAL/PLATELET
Abs Immature Granulocytes: 0.12 10*3/uL — ABNORMAL HIGH (ref 0.00–0.07)
Basophils Absolute: 0.1 10*3/uL (ref 0.0–0.1)
Basophils Relative: 0 %
Eosinophils Absolute: 0.3 10*3/uL (ref 0.0–0.5)
Eosinophils Relative: 2 %
HCT: 38.3 % (ref 36.0–46.0)
Hemoglobin: 12.7 g/dL (ref 12.0–15.0)
Immature Granulocytes: 1 %
Lymphocytes Relative: 20 %
Lymphs Abs: 3.3 10*3/uL (ref 0.7–4.0)
MCH: 27.1 pg (ref 26.0–34.0)
MCHC: 33.2 g/dL (ref 30.0–36.0)
MCV: 81.7 fL (ref 80.0–100.0)
Monocytes Absolute: 1.2 10*3/uL — ABNORMAL HIGH (ref 0.1–1.0)
Monocytes Relative: 8 %
Neutro Abs: 11.5 10*3/uL — ABNORMAL HIGH (ref 1.7–7.7)
Neutrophils Relative %: 69 %
Platelets: 274 10*3/uL (ref 150–400)
RBC: 4.69 MIL/uL (ref 3.87–5.11)
RDW: 14.2 % (ref 11.5–15.5)
WBC: 16.5 10*3/uL — ABNORMAL HIGH (ref 4.0–10.5)
nRBC: 0 % (ref 0.0–0.2)

## 2023-05-16 LAB — COMPREHENSIVE METABOLIC PANEL
ALT: 20 U/L (ref 0–44)
AST: 27 U/L (ref 15–41)
Albumin: 3.2 g/dL — ABNORMAL LOW (ref 3.5–5.0)
Alkaline Phosphatase: 108 U/L (ref 38–126)
Anion gap: 7 (ref 5–15)
BUN: 12 mg/dL (ref 6–20)
CO2: 22 mmol/L (ref 22–32)
Calcium: 9 mg/dL (ref 8.9–10.3)
Chloride: 106 mmol/L (ref 98–111)
Creatinine, Ser: 0.79 mg/dL (ref 0.44–1.00)
GFR, Estimated: >60 mL/min (ref >60)
Glucose, Bld: 99 mg/dL (ref 70–99)
Potassium: 4 mmol/L (ref 3.5–5.1)
Sodium: 135 mmol/L (ref 135–145)
Total Bilirubin: 0.5 mg/dL (ref 0.3–1.2)
Total Protein: 6.7 g/dL (ref 6.5–8.1)

## 2023-05-16 LAB — PROTEIN / CREATININE RATIO, URINE
Creatinine, Urine: 24 mg/dL
Total Protein, Urine: <6 mg/dL

## 2023-05-16 MED ORDER — ACETAMINOPHEN 500 MG PO TABS: 1000.0000 mg | ORAL_TABLET | Freq: Four times a day (QID) | ORAL | Status: DC | PRN

## 2023-05-16 MED ORDER — CALCIUM CARBONATE ANTACID 500 MG PO CHEW
2.0000 | CHEWABLE_TABLET | ORAL | Status: DC | PRN
  Administered 2023-05-16 – 2023-05-19 (×7): 400 mg via ORAL
  Filled 2023-05-16 (×7): qty 2

## 2023-05-16 NOTE — OB Triage Note (Signed)
Pt arrives from home with c/o high BP at home 165/100 with a HA that went away after a nap. Pt denies HA at this time or vaginal bleeding or ctx's. Pt states that she is currently on no BP medication.

## 2023-05-16 NOTE — H&P (Signed)
Lynn Bean is a 37 y.o. female. She is at [redacted]w[redacted]d gestation. Patient's last menstrual period was 09/01/2022. 06/08/2023, by Last Menstrual Period   Prenatal care site: [facility]  Chief complaint: elevated blood pressure  Admission Diagnoses:  1) intrauterine pregnancy at [redacted]w[redacted]d  2) Gestational hypertension [O13.9]  HPI: Lynn Bean presents to L&D with complaints of elevated blood pressure at home.  She reports that she woke up from her nap and her blood pressure was 165/105.  She's had an intermittent HA all day that improved after she rested.  Denies changes in her vision or RUQ pain.  Her pregnancy is complicated by gestational hypertension, macrosomia, previous cesarean birth, AMA, and obesity in pregnancy. .  She denies Contractions, Loss of fluid, or Vaginal bleeding. Endorses fetal movement as active.   Factors complicating pregnancy: Previous cesarean birth Gestational hypertension Macrosomia  Obesity in pregnancy AMA  S: Resting comfortably. no CTX, no VB.no LOF,  Active fetal movement.   Maternal Medical History:  Past Medical Hx:  has a past medical history of Acne, 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, Pregnancy induced hypertension, and Ruptured disc, thoracic (2007).    Past Surgical Hx:  has a past surgical history that includes Cesarean section (N/A, 06/29/2019).   Allergies  Allergen Reactions   Benadryl [Diphenhydramine]     Only brand name benadryl     Prior to Admission medications   Medication Sig Start Date End Date Taking? Authorizing Provider  acetaminophen (TYLENOL) 500 MG tablet Take 2 tablets (1,000 mg total) by mouth 4 (four) times daily. 07/02/19   McVey, Lynn Bean, CNM  ascorbic acid (VITAMIN C) 500 MG tablet Take 500 mg by mouth daily.    [provider]  aspirin EC 81 MG tablet Take 81 mg by mouth daily. Swallow whole.    [provider]  cholecalciferol (VITAMIN D3) 25 MCG (1000 UNIT) tablet Take  1,000 Units by mouth daily.    [provider]  magnesium (MAGTAB) 84 MG ( ) TBCR SR tablet Take 200 mg by mouth daily.    [provider]  Prenatal Vit-Fe Fumarate-FA (PRENATAL MULTIVITAMIN) TABS tablet Take 1 tablet by mouth daily at 12 noon.    [provider]    Social History: She  reports that she has never smoked. She has never used smokeless tobacco. She reports that she does not currently use alcohol. She reports that she does not use drugs.  Family History: family history includes Hypertension in her father; Lymphoma in her paternal grandfather; Prostate cancer in her paternal grandfather.    Review of Systems: A full review of systems was performed and negative except as noted in the HPI.     Pertinent Results:  Prenatal Labs: Blood type/Rh B POS   Antibody screen Negative    Rubella Immune    Varicella Immune  RPR NR    HBsAg Neg   Hep C NR   HIV NR    GC neg  Chlamydia neg  Genetic screening cfDNA negative  1 hour GTT 106  3 hour GTT N/A  GBS Pending      O:  BP 130/68   Pulse 90   Temp 98.8 F (37.1 C) (Oral)   Resp 19   Ht 5\' 6"  (1.676 m)   Wt 108.9 kg   LMP 09/01/2022   BMI 38.74 kg/m  Results for orders placed or performed during the hospital encounter of 05/16/23 (from the past 48 hour(s))  Protein /  creatinine ratio, urine   Collection Time: 05/16/23  7:32 PM  Result Value Ref Range   Creatinine, Urine 24 mg/dL   Total Protein, Urine <6 mg/dL   Protein Creatinine Ratio        0.00 - 0.15 mg/mg[Cre]  Comprehensive metabolic panel   Collection Time: 05/16/23  7:32 PM  Result Value Ref Range   Sodium 135 135 - 145 mmol/L   Potassium 4.0 3.5 - 5.1 mmol/L   Chloride 106 98 - 111 mmol/L   CO2 22 22 - 32 mmol/L   Glucose, Bld 99 70 - 99 mg/dL   BUN 12 6 - 20 mg/dL   Creatinine, Ser 1.61 0.44 - 1.00 mg/dL   Calcium 9.0 8.9 - 09.6 mg/dL   Total Protein 6.7 6.5 - 8.1 g/dL   Albumin 3.2 (L) 3.5 - 5.0 g/dL   AST 27 15 -  41 U/L   ALT 20 0 - 44 U/L   Alkaline Phosphatase 108 38 - 126 U/L   Total Bilirubin 0.5 0.3 - 1.2 mg/dL   GFR, Estimated >04 >54 mL/min   Anion gap 7 5 - 15  CBC with Differential   Collection Time: 05/16/23  7:32 PM  Result Value Ref Range   WBC 16.5 (H) 4.0 - 10.5 K/uL   RBC 4.69 3.87 - 5.11 MIL/uL   Hemoglobin 12.7 12.0 - 15.0 g/dL   HCT 09.8 11.9 - 14.7 %   MCV 81.7 80.0 - 100.0 fL   MCH 27.1 26.0 - 34.0 pg   MCHC 33.2 30.0 - 36.0 g/dL   RDW 82.9 56.2 - 13.0 %   Platelets 274 150 - 400 K/uL   nRBC 0.0 0.0 - 0.2 %   Neutrophils Relative % 69 %   Neutro Abs 11.5 (H) 1.7 - 7.7 K/uL   Lymphocytes Relative 20 %   Lymphs Abs 3.3 0.7 - 4.0 K/uL   Monocytes Relative 8 %   Monocytes Absolute 1.2 (H) 0.1 - 1.0 K/uL   Eosinophils Relative 2 %   Eosinophils Absolute 0.3 0.0 - 0.5 K/uL   Basophils Relative 0 %   Basophils Absolute 0.1 0.0 - 0.1 K/uL   Immature Granulocytes 1 %   Abs Immature Granulocytes 0.12 (H) 0.00 - 0.07 K/uL     Constitutional: NAD, AAOx3  PULM: nl respiratory effort Abd: gravid, non-tender, non-distended, soft  Ext: Non-tender, Nonedmeatous Psych: mood appropriate, speech normal Pelvic : deferred  NST: Baseline FHR: 130 beats/min Variability: moderate Accelerations: present Decelerations: absent Tocometry: Irregular, mild contractions  Time: at least 20 minutes   Interpretation: Category I INDICATIONS: gestational hypertension RESULTS:  A NST procedure was performed with FHR monitoring and a normal baseline established, appropriate time of 20-40 minutes of evaluation, and accels >2 seen w 15x15 characteristics.  Results show a REACTIVE NST.   Assessment: 37 y.o. [id number] [redacted]w[redacted]d 06/08/2023, by Last Menstrual Period   Principle diagnosis: Gestational hypertension [O13.9]   Plan: 1) Observation to antepartum for gestational hypertension -Discussed plan of care with Dr. Feliberto Gottron. Will observe overnight for continued BP monitoring and repeat labs.   She has a history of preeclampsia with severe features at 36 weeks in her previous pregnancy.   -Routine antepartum care  -Reg diet  -Reactive NST, Category 1 tracing  -Reassuring fetal status  -Repeat NST in AM   2) Gestational hypertension  -No severe range BP's noted, BP's 130-140/70-100's.   -Preeclampsia labs WNL  -Blood pressures q 1 hour  -Repeat Preeclampsia labs in AM  -----  [name], CNM Certified Nurse Midwife [place]  [facility]

## 2023-05-17 ENCOUNTER — Ambulatory Visit: Payer: BC Managed Care – PPO

## 2023-05-17 DIAGNOSIS — O139 Gestational [pregnancy-induced] hypertension without significant proteinuria, unspecified trimester: Secondary | ICD-10-CM | POA: Diagnosis present

## 2023-05-17 DIAGNOSIS — Z7982 Long term (current) use of aspirin: Secondary | ICD-10-CM | POA: Diagnosis not present

## 2023-05-17 DIAGNOSIS — Z8279 Family history of other congenital malformations, deformations and chromosomal abnormalities: Secondary | ICD-10-CM | POA: Diagnosis not present

## 2023-05-17 DIAGNOSIS — Z8249 Family history of ischemic heart disease and other diseases of the circulatory system: Secondary | ICD-10-CM | POA: Diagnosis not present

## 2023-05-17 DIAGNOSIS — O134 Gestational [pregnancy-induced] hypertension without significant proteinuria, complicating childbirth: Secondary | ICD-10-CM | POA: Diagnosis present

## 2023-05-17 DIAGNOSIS — R030 Elevated blood-pressure reading, without diagnosis of hypertension: Secondary | ICD-10-CM | POA: Diagnosis present

## 2023-05-17 DIAGNOSIS — O34211 Maternal care for low transverse scar from previous cesarean delivery: Secondary | ICD-10-CM | POA: Diagnosis present

## 2023-05-17 DIAGNOSIS — Z3A37 37 weeks gestation of pregnancy: Secondary | ICD-10-CM | POA: Diagnosis not present

## 2023-05-17 DIAGNOSIS — O3663X0 Maternal care for excessive fetal growth, third trimester, not applicable or unspecified: Secondary | ICD-10-CM | POA: Diagnosis present

## 2023-05-17 DIAGNOSIS — O99214 Obesity complicating childbirth: Secondary | ICD-10-CM | POA: Diagnosis present

## 2023-05-17 DIAGNOSIS — O339 Maternal care for disproportion, unspecified: Secondary | ICD-10-CM | POA: Diagnosis present

## 2023-05-17 LAB — CBC
HCT: 33.1 % — ABNORMAL LOW (ref 36.0–46.0)
Hemoglobin: 10.7 g/dL — ABNORMAL LOW (ref 12.0–15.0)
MCH: 27 pg (ref 26.0–34.0)
MCHC: 32.3 g/dL (ref 30.0–36.0)
MCV: 83.4 fL (ref 80.0–100.0)
Platelets: 189 10*3/uL (ref 150–400)
RBC: 3.97 MIL/uL (ref 3.87–5.11)
RDW: 14.3 % (ref 11.5–15.5)
WBC: 11.6 10*3/uL — ABNORMAL HIGH (ref 4.0–10.5)
nRBC: 0.2 % (ref 0.0–0.2)

## 2023-05-17 LAB — COMPREHENSIVE METABOLIC PANEL
ALT: 17 U/L (ref 0–44)
AST: 29 U/L (ref 15–41)
Albumin: 2.6 g/dL — ABNORMAL LOW (ref 3.5–5.0)
Alkaline Phosphatase: 95 U/L (ref 38–126)
Anion gap: 8 (ref 5–15)
BUN: 9 mg/dL (ref 6–20)
CO2: 18 mmol/L — ABNORMAL LOW (ref 22–32)
Calcium: 8.7 mg/dL — ABNORMAL LOW (ref 8.9–10.3)
Chloride: 108 mmol/L (ref 98–111)
Creatinine, Ser: 0.78 mg/dL (ref 0.44–1.00)
GFR, Estimated: >60 mL/min (ref >60)
Glucose, Bld: 90 mg/dL (ref 70–99)
Potassium: 4.4 mmol/L (ref 3.5–5.1)
Sodium: 134 mmol/L — ABNORMAL LOW (ref 135–145)
Total Bilirubin: 0.6 mg/dL (ref 0.3–1.2)
Total Protein: 5.8 g/dL — ABNORMAL LOW (ref 6.5–8.1)

## 2023-05-17 LAB — PROTEIN / CREATININE RATIO, URINE
Creatinine, Urine: 51 mg/dL
Total Protein, Urine: <6 mg/dL

## 2023-05-17 MED ORDER — OXYTOCIN BOLUS FROM INFUSION: 333.0000 mL | Freq: Once | INTRAVENOUS | Status: DC

## 2023-05-17 MED ORDER — LACTATED RINGERS IV SOLN: 500.0000 mL | INTRAVENOUS | Status: DC | PRN

## 2023-05-17 MED ORDER — FENTANYL CITRATE (PF) 100 MCG/2ML IJ SOLN
50.0000 ug | INTRAMUSCULAR | Status: DC | PRN
  Administered 2023-05-19 (×2): 100 ug via INTRAVENOUS
  Filled 2023-05-17 (×2): qty 2

## 2023-05-17 MED ORDER — LIDOCAINE HCL (PF) 1 % IJ SOLN
30.0000 mL | INTRAMUSCULAR | Status: DC | PRN
  Filled 2023-05-17: qty 30

## 2023-05-17 MED ORDER — SOD CITRATE-CITRIC ACID 500-334 MG/5ML PO SOLN: 30.0000 mL | ORAL | Status: DC | PRN

## 2023-05-17 MED ORDER — ONDANSETRON HCL 4 MG/2ML IJ SOLN
4.0000 mg | Freq: Four times a day (QID) | INTRAMUSCULAR | Status: DC | PRN
  Administered 2023-05-19 (×2): 4 mg via INTRAVENOUS
  Filled 2023-05-17 (×2): qty 2

## 2023-05-17 MED ORDER — PRENATAL MULTIVITAMIN CH
1.0000 | ORAL_TABLET | Freq: Every day | ORAL | Status: DC
  Administered 2023-05-19 – 2023-05-21 (×3): 1 via ORAL
  Filled 2023-05-17 (×2): qty 1

## 2023-05-17 MED ORDER — ACETAMINOPHEN 325 MG PO TABS
650.0000 mg | ORAL_TABLET | ORAL | Status: DC | PRN
  Administered 2023-05-19: 650 mg via ORAL
  Filled 2023-05-17: qty 2

## 2023-05-17 MED ORDER — LACTATED RINGERS IV SOLN: INTRAVENOUS | Status: DC

## 2023-05-17 MED ORDER — OXYTOCIN-SODIUM CHLORIDE 30-0.9 UT/500ML-% IV SOLN
2.5000 [IU]/h | INTRAVENOUS | Status: DC
  Administered 2023-05-19: 30 [IU] via INTRAVENOUS
  Filled 2023-05-17 (×2): qty 500

## 2023-05-17 MED ORDER — CALCIUM CARBONATE ANTACID 500 MG PO CHEW: 2.0000 | CHEWABLE_TABLET | ORAL | Status: DC | PRN

## 2023-05-17 NOTE — Progress Notes (Signed)
   05/17/23 1440  Fetal Heart Rate A  Mode External  Baseline Rate (A) 140 bpm  Variability 6-25 BPM  Accelerations 15 x 15  Decelerations None  Uterine Activity  Mode Toco  Contraction Frequency (min) UI   Reactive NST.

## 2023-05-17 NOTE — H&P (Signed)
OB History & Physical   History of Present Illness:  Chief Complaint:   HPI:  Lynn Bean is a 37 y.o. G25P0101 female at [redacted]w[redacted]d dated by LMP.  She presents to L&D for gestational HTN with severe-range blood pressures at home. She woke up from a nap yesterday and her BP was 165/105. She also had an intermittent headache. Blood pressures have been reassuring in L&D with a few 140/80 range blood pressures, no severe range blood pressures. Her labs have stayed reassuring. She denies current headache, changes of vision, or RUQ pain. She is scheduled for an induction of labor at [redacted]w[redacted]d and was offered to discharge and return at midnight for scheduled induction. She reports she does not feel comfortable discharging, that she had similar symptoms with her last birth where she developed rapid-onset pre-eclampsia with severe features and kidney failure. She requests to remain inpatient today to monitor for pre-eclampsia then start the IOL after midnight as previously planned. After discussing with her, will keep inpatient with hourly blood pressure checks until induction after midnight.     Pregnancy Issues: 1. Gestational HTN 2. Previous cesarean section 3. Fetal macrosomia 4. Obesity in pregnancy 5. 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 Authorizing Provider  acetaminophen (TYLENOL) 500 MG tablet Take 2  tablets (1,000 mg total) by mouth 4 (four) times daily. 07/02/19   McVey, Prudencio Pair, CNM  ascorbic acid (VITAMIN C) 500 MG tablet Take 500 mg by mouth daily.    [provider]  aspirin EC 81 MG tablet Take 81 mg by mouth daily. Swallow whole.    [provider]  cholecalciferol (VITAMIN D3) 25 MCG (1000 UNIT) tablet Take 1,000 Units by mouth daily.    [provider]  magnesium (MAGTAB) 84 MG ( ) TBCR SR tablet Take 200 mg by mouth daily.    [provider]  Prenatal Vit-Fe Fumarate-FA (PRENATAL MULTIVITAMIN) TABS tablet Take 1 tablet by mouth daily at 12 noon.    [provider]    Prenatal care site: [facility]   Social History: She  reports that she has never smoked. She has never used smokeless tobacco. She reports that she does not currently use alcohol. She reports that she does not use drugs.  Family History: family history includes Hypertension in her father; Lymphoma in her paternal grandfather; Prostate cancer in her paternal grandfather.   Review of Systems: A full review of systems was performed and negative except as noted in the HPI.     Physical Exam:  Vital Signs: BP 128/80   Pulse (!) 118   Temp 98.2 F (36.8 C) (Oral)   Resp 18   Ht 5\' 6"  (1.676 m)   Wt 108.9 kg   LMP 09/01/2022   BMI 38.74 kg/m  General: no acute distress.  HEENT: normocephalic, atraumatic Heart: regular  rate & rhythm.  No murmurs/rubs/gallops Lungs: clear to auscultation bilaterally, normal respiratory effort Abdomen: soft, gravid, non-tender;  EFW: 9lb Pelvic:   External: Normal external female genitalia  Cervix: deferred   Extremities: non-tender, symmetric, mild edema bilaterally.  DTRs: +2  Neurologic: Alert & oriented x 3.    Results for orders placed or performed during the hospital encounter of 05/16/23 (from the past 24 hour(s))  Protein / creatinine ratio, urine     Status: None   Collection Time: 05/16/23  7:32 PM   Result Value Ref Range   Creatinine, Urine 24 mg/dL   Total Protein, Urine <6 mg/dL   Protein Creatinine Ratio        0.00 - 0.15 mg/mg[Cre]  Comprehensive metabolic panel     Status: Abnormal   Collection Time: 05/16/23  7:32 PM  Result Value Ref Range   Sodium 135 135 - 145 mmol/L   Potassium 4.0 3.5 - 5.1 mmol/L   Chloride 106 98 - 111 mmol/L   CO2 22 22 - 32 mmol/L   Glucose, Bld 99 70 - 99 mg/dL   BUN 12 6 - 20 mg/dL   Creatinine, Ser 5.28 0.44 - 1.00 mg/dL   Calcium 9.0 8.9 - 41.3 mg/dL   Total Protein 6.7 6.5 - 8.1 g/dL   Albumin 3.2 (L) 3.5 - 5.0 g/dL   AST 27 15 - 41 U/L   ALT 20 0 - 44 U/L   Alkaline Phosphatase 108 38 - 126 U/L   Total Bilirubin 0.5 0.3 - 1.2 mg/dL   GFR, Estimated >24 >40 mL/min   Anion gap 7 5 - 15  CBC with Differential     Status: Abnormal   Collection Time: 05/16/23  7:32 PM  Result Value Ref Range   WBC 16.5 (H) 4.0 - 10.5 K/uL   RBC 4.69 3.87 - 5.11 MIL/uL   Hemoglobin 12.7 12.0 - 15.0 g/dL   HCT 10.2 72.5 - 36.6 %   MCV 81.7 80.0 - 100.0 fL   MCH 27.1 26.0 - 34.0 pg   MCHC 33.2 30.0 - 36.0 g/dL   RDW 44.0 34.7 - 42.5 %   Platelets 274 150 - 400 K/uL   nRBC 0.0 0.0 - 0.2 %   Neutrophils Relative % 69 %   Neutro Abs 11.5 (H) 1.7 - 7.7 K/uL   Lymphocytes Relative 20 %   Lymphs Abs 3.3 0.7 - 4.0 K/uL   Monocytes Relative 8 %   Monocytes Absolute 1.2 (H) 0.1 - 1.0 K/uL   Eosinophils Relative 2 %   Eosinophils Absolute 0.3 0.0 - 0.5 K/uL   Basophils Relative 0 %   Basophils Absolute 0.1 0.0 - 0.1 K/uL   Immature Granulocytes 1 %   Abs Immature Granulocytes 0.12 (H) 0.00 - 0.07 K/uL  CBC     Status: Abnormal   Collection Time: 05/17/23  6:17 AM  Result Value Ref Range   WBC 11.6 (H) 4.0 - 10.5 K/uL   RBC 3.97 3.87 - 5.11 MIL/uL   Hemoglobin 10.7 (L) 12.0 - 15.0 g/dL   HCT 95.6 (L) 38.7 - 56.4 %   MCV 83.4 80.0 - 100.0 fL   MCH 27.0 26.0 - 34.0 pg   MCHC 32.3 30.0 - 36.0 g/dL   RDW 33.2 95.1 - 88.4 %   Platelets 189 150 - 400  K/uL   nRBC 0.2 0.0 - 0.2 %  Protein / creatinine ratio, urine     Status: None   Collection  Time: 05/17/23  6:17 AM  Result Value Ref Range   Creatinine, Urine 51 mg/dL   Total Protein, Urine <6 mg/dL   Protein Creatinine Ratio        0.00 - 0.15 mg/mg[Cre]  Comprehensive metabolic panel     Status: Abnormal   Collection Time: 05/17/23  7:20 AM  Result Value Ref Range   Sodium 134 (L) 135 - 145 mmol/L   Potassium 4.4 3.5 - 5.1 mmol/L   Chloride 108 98 - 111 mmol/L   CO2 18 (L) 22 - 32 mmol/L   Glucose, Bld 90 70 - 99 mg/dL   BUN 9 6 - 20 mg/dL   Creatinine, Ser 1.61 0.44 - 1.00 mg/dL   Calcium 8.7 (L) 8.9 - 10.3 mg/dL   Total Protein 5.8 (L) 6.5 - 8.1 g/dL   Albumin 2.6 (L) 3.5 - 5.0 g/dL   AST 29 15 - 41 U/L   ALT 17 0 - 44 U/L   Alkaline Phosphatase 95 38 - 126 U/L   Total Bilirubin 0.6 0.3 - 1.2 mg/dL   GFR, Estimated >09 >60 mL/min   Anion gap 8 5 - 15    Pertinent Results:  Prenatal Labs: Blood type/Rh B POS   Antibody screen Negative    Rubella Immune    Varicella Immune  RPR NR    HBsAg Neg   Hep C NR   HIV NR    GC neg  Chlamydia neg  Genetic screening cfDNA negative  1 hour GTT 106  3 hour GTT N/A  GBS Negative   From NST at 0600 FHT: 135bpm, moderate variability, accelerations present, no decelerations, Category I tracing TOCO: uterine irritability SVE:   deferred   Cephalic by leopolds  [place] MFM OB DETAIL +14 WK  Result Date: 05/10/2023 ----------------------------------------------------------------------  OBSTETRICS REPORT                       (Signed Final 05/10/2023 05:35 pm) ---------------------------------------------------------------------- Patient Info  ID #:       [id number]                          D.O.B.:  Sep 04, 1986 (36 yrs)  Name:       Lynn Bean               Visit Date: 05/10/2023 01:27 pm ---------------------------------------------------------------------- Performed By  Attending:        Braxton Feathers DO       Ref. Address:      [facility]                                                             [address], [place],  [place] [address]  Performed By:     Emeline Darling BS,      Location[facility]                                     [facility]  Referred By:      Haroldine Laws ---------------------------------------------------------------------- Orders  #  Description                           Code        Ordered By  1  [place] MFM OB DETAIL +14 WK               76811.01    JENNIFER OXLEY  2  [place] MFM FETAL BPP WO NON               76819.01    JENNIFER OXLEY     STRESS ----------------------------------------------------------------------  #  Order #                     Accession #                Episode #  1  [id number]                   [id number]                 [id number]  2  [id number]                   [id number]                 [id number] ---------------------------------------------------------------------- Indications  Large for gestational age fetus affecting      O36.60X0  management of mother  Elevated blood pressure affecting pregnancy    O13.3  in third trimester  Poor obstetric history: Previous               O09.299  preeclampsia / eclampsia/gestational HTN  Poor obstetric history: Previous preterm       O09.219  delivery, antepartum (36 wks 2 d)  Advanced maternal age multigravida 58+,        O47.523  third trimester (36 yrs)  History of cesarean delivery, currently        O38.219  pregnant  [redacted] weeks gestation of pregnancy                Z3A.35 ---------------------------------------------------------------------- Vital Signs  BP:  144/78 ---------------------------------------------------------------------- Fetal  Evaluation  Num Of Fetuses:         1  Fetal Heart Rate(bpm):  146  Cardiac Activity:       Observed  Presentation:           Cephalic  Placenta:               Anterior  P. Cord Insertion:      Not well visualized  Amniotic Fluid  AFI FV:      Within normal limits  AFI Sum(cm)     %Tile       Largest Pocket(cm)  22.03           83          7.84  RUQ(cm)       RLQ(cm)       LUQ(cm)        LLQ(cm)  3.28          4.84          7.84           6.07 ---------------------------------------------------------------------- Biophysical Evaluation  Amniotic F.V:   Pocket => 2 cm             F. Tone:        Observed  F. Movement:    Observed                   Score:          8/8  F. Breathing:   Observed ---------------------------------------------------------------------- Biometry  BPD:      97.4  mm     G. Age:  39w 6d       > 99  %    CI:        79.61   %    70 - 86                                                          FL/HC:      21.0   %    20.1 - 22.1  HC:       345   mm     G. Age:  39w 6d         96  %    HC/AC:      0.93        0.93 - 1.11  AC:      371.2  mm     G. Age:  41w 0d       > 99  %    FL/BPD:     74.4   %    71 - 87  FL:       72.5  mm     G. Age:  37w 1d         77  %    FL/AC:      19.5   %    20 - 24  Est. FW:    3958  gm    8 lb 12 oz    > 99  % ---------------------------------------------------------------------- OB History  Gravidity:    2         Term:   0        Prem:   1        SAB:  0  TOP:          0       Ectopic:  0        Living: 1 ---------------------------------------------------------------------- Gestational Age  LMP:           35w 6d        Date:  09/01/22                 EDD:   06/08/23  U/S Today:     39w 3d                                        EDD:   05/14/23  Best:          35w 6d     Det. By:  LMP  (09/01/22)          EDD:   06/08/23 ---------------------------------------------------------------------- Anatomy  Cranium:               Appears normal         Aortic Arch:             Not well visualized  Cavum:                 Appears normal         Ductal Arch:            Not well visualized  Ventricles:            Appears normal         Diaphragm:              Appears normal  Choroid Plexus:        Appears normal         Stomach:                Appears normal, left                                                                        sided  Cerebellum:            Not well visualized    Abdomen:                Appears normal  Posterior Fossa:       Not well visualized    Abdominal Wall:         Appears nml (cord                                                                        insert, abd wall)  Nuchal Fold:           Not applicable (>20    Cord Vessels:           Not well visualized  wks GA)  Face:                  Appears normal         Kidneys:                Appear normal                         (orbits and profile)  Lips:                  Appears normal         Bladder:                Appears normal  Thoracic:              Appears normal         Spine:                  Not well visualized  Heart:                 Appears normal         Upper Extremities:      Not well visualized                         (4CH, axis, and                         situs)  RVOT:                  Appears normal         Lower Extremities:      Visualized  LVOT:                  Appears normal  Other:  Technically difficult due to advanced gestational age. 3VV visualized. ---------------------------------------------------------------------- Cervix Uterus Adnexa  Cervix  Not visualized (advanced GA >24wks) ---------------------------------------------------------------------- Comments  MFM CONSULT  Jochebed L Holdman is a 37 y.o. G2P0101 at [redacted]w[redacted]d here for  ultrasound and consultation.  The patient was referred here for a large for gestational age  fetus.  The fetal weight is at the greater than the 99th  percentile.  I discussed the concern for fetal macrosomia.  Notably her blood pressure  was elevated today at 142/87.  And 144/78.  I discussed my concern for potential  preeclampsia especially given her history.  She reports that  she feels fine and denies any headaches, vision changes  right upper quadrant pain.  She did verbalize that she  preferred to go to [facility] to be assessed since she lives  in that area.  Since she has been asymptomatic I instructed  her that it is reasonable to drive to [facility] for assessment  at L&D.  Sonographic findings  Single intrauterine pregnancy at 35w 6d.  Fetal cardiac activity:  Observed and appears normal.  Presentation: Cephalic.  The anatomic structures that were well seen appear normal  without evidence of soft markers. Due to poor acoustic  windows some structures remain suboptimally visualized.  Fetal biometry shows the estimated fetal weight at the > 99  percentile and the abdominal circumference at the >99th  percentile.  Amniotic fluid:  MVP: 7.84 cm.  Placenta: Anterior.  Adnexa:No masses visualized.  BPP 8/8.  Assessment  Elevated blood pressure affecting pregnancy in third  trimester, antepartum  Hx of preeclampsia, prior pregnancy, currently pregnant, third  trimester  Multigravida of advanced maternal age in third trimester  Previous cesarean delivery, antepartum  Excessive fetal growth affecting management of pregnancy,  antepartum, single or unspecified fetus  Plan  -Sent to labor and delivery for preeclampsia rule out.  If she is  discharged she will need weekly outpatient labs (CBC, CMP),  antenatal testing and BP check  -If her blood pressure is elevated again she would rule in for  gestational hypertension and need to be delivered at 37  weeks via repeat C-section. ----------------------------------------------------------------------                 Braxton Feathers, DO Electronically Signed Final Report   05/10/2023 05:35 pm ----------------------------------------------------------------------  [place] MFM FETAL BPP WO NON STRESS  Result  Date: 05/10/2023 ----------------------------------------------------------------------  OBSTETRICS REPORT                       (Signed Final 05/10/2023 05:35 pm) ---------------------------------------------------------------------- Patient Info  ID #:       [id number]                          D.O.B.:  1986/06/18 (36 yrs)  Name:       Lynn Bean               Visit Date: 05/10/2023 01:27 pm ---------------------------------------------------------------------- Performed By  Attending:        Braxton Feathers DO       Ref. Address:     [facility]                                                             [address]                                                             [place], [place],                                                             [place] [address]  Performed By:     Emeline Darling BS,      Location:         [facility]                    [facility]                                     [facility] at                                                             [facility]                                                             [facility]  Referred By:      Haroldine Laws ---------------------------------------------------------------------- Orders  #  Description                           Code        Ordered By  1  [place] MFM OB DETAIL +14 WK               76811.01    JENNIFER OXLEY  2  [place] MFM FETAL BPP WO NON               76819.01    JENNIFER OXLEY     STRESS ----------------------------------------------------------------------  #  Order #                     Accession #                Episode #  1  [id number]                   [id number]                 [id number]  2  [id number]                   [id number]                 [id number] ---------------------------------------------------------------------- Indications  Large for gestational age fetus affecting      O36.60X0  management of mother  Elevated blood pressure affecting pregnancy    O13.3  in third trimester  Poor obstetric history: Previous                O09.299  preeclampsia / eclampsia/gestational HTN  Poor obstetric history: Previous preterm       O09.219  delivery, antepartum (36 wks 2 d)  Advanced maternal age multigravida 48+,        O28.523  third trimester (36 yrs)  History of cesarean delivery, currently        O57.219  pregnant  [redacted] weeks gestation of pregnancy                Z3A.35 ---------------------------------------------------------------------- Vital Signs  BP:          144/78 ---------------------------------------------------------------------- Fetal Evaluation  Num Of Fetuses:         1  Fetal Heart Rate(bpm):  146  Cardiac Activity:       Observed  Presentation:           Cephalic  Placenta:               Anterior  P. Cord Insertion:      Not well visualized  Amniotic Fluid  AFI FV:      Within normal limits  AFI Sum(cm)     %Tile       Largest Pocket(cm)  22.03           83          7.84  RUQ(cm)       RLQ(cm)       LUQ(cm)  LLQ(cm)  3.28          4.84          7.84           6.07 ---------------------------------------------------------------------- Biophysical Evaluation  Amniotic F.V:   Pocket => 2 cm             F. Tone:        Observed  F. Movement:    Observed                   Score:          8/8  F. Breathing:   Observed ---------------------------------------------------------------------- Biometry  BPD:      97.4  mm     G. Age:  39w 6d       > 99  %    CI:        79.61   %    70 - 86                                                          FL/HC:      21.0   %    20.1 - 22.1  HC:       345   mm     G. Age:  39w 6d         96  %    HC/AC:      0.93        0.93 - 1.11  AC:      371.2  mm     G. Age:  41w 0d       > 99  %    FL/BPD:     74.4   %    71 - 87  FL:       72.5  mm     G. Age:  37w 1d         77  %    FL/AC:      19.5   %    20 - 24  Est. FW:    3958  gm    8 lb 12 oz    > 99  % ---------------------------------------------------------------------- OB History  Gravidity:    2         Term:   0        Prem:    1        SAB:   0  TOP:          0       Ectopic:  0        Living: 1 ---------------------------------------------------------------------- Gestational Age  LMP:           35w 6d        Date:  09/01/22                 EDD:   06/08/23  U/S Today:     39w 3d                                        EDD:   05/14/23  Best:          35w 6d     Det. By:  LMP  (09/01/22)          EDD:   06/08/23 ---------------------------------------------------------------------- Anatomy  Cranium:               Appears normal         Aortic Arch:            Not well visualized  Cavum:                 Appears normal         Ductal Arch:            Not well visualized  Ventricles:            Appears normal         Diaphragm:              Appears normal  Choroid Plexus:        Appears normal         Stomach:                Appears normal, left                                                                        sided  Cerebellum:            Not well visualized    Abdomen:                Appears normal  Posterior Fossa:       Not well visualized    Abdominal Wall:         Appears nml (cord                                                                        insert, abd wall)  Nuchal Fold:           Not applicable (>20    Cord Vessels:           Not well visualized                         wks GA)  Face:                  Appears normal         Kidneys:                Appear normal                         (orbits and profile)  Lips:                  Appears normal         Bladder:                Appears normal  Thoracic:              Appears normal         Spine:  Not well visualized  Heart:                 Appears normal         Upper Extremities:      Not well visualized                         (4CH, axis, and                         situs)  RVOT:                  Appears normal         Lower Extremities:      Visualized  LVOT:                  Appears normal  Other:  Technically difficult due to advanced gestational age. 3VV  visualized. ---------------------------------------------------------------------- Cervix Uterus Adnexa  Cervix  Not visualized (advanced GA >24wks) ---------------------------------------------------------------------- Comments  MFM CONSULT  Keanu L Knappenberger is a 37 y.o. G2P0101 at [redacted]w[redacted]d here for  ultrasound and consultation.  The patient was referred here for a large for gestational age  fetus.  The fetal weight is at the greater than the 99th  percentile.  I discussed the concern for fetal macrosomia.  Notably her blood pressure was elevated today at 142/87.  And 144/78.  I discussed my concern for potential  preeclampsia especially given her history.  She reports that  she feels fine and denies any headaches, vision changes  right upper quadrant pain.  She did verbalize that she  preferred to go to [facility] to be assessed since she lives  in that area.  Since she has been asymptomatic I instructed  her that it is reasonable to drive to [facility] for assessment  at L&D.  Sonographic findings  Single intrauterine pregnancy at 35w 6d.  Fetal cardiac activity:  Observed and appears normal.  Presentation: Cephalic.  The anatomic structures that were well seen appear normal  without evidence of soft markers. Due to poor acoustic  windows some structures remain suboptimally visualized.  Fetal biometry shows the estimated fetal weight at the > 99  percentile and the abdominal circumference at the >99th  percentile.  Amniotic fluid:  MVP: 7.84 cm.  Placenta: Anterior.  Adnexa:No masses visualized.  BPP 8/8.  Assessment  Elevated blood pressure affecting pregnancy in third  trimester, antepartum  Hx of preeclampsia, prior pregnancy, currently pregnant, third  trimester  Multigravida of advanced maternal age in third trimester  Previous cesarean delivery, antepartum  Excessive fetal growth affecting management of pregnancy,  antepartum, single or unspecified fetus  Plan  -Sent to labor and delivery for preeclampsia  rule out.  If she is  discharged she will need weekly outpatient labs (CBC, CMP),  antenatal testing and BP check  -If her blood pressure is elevated again she would rule in for  gestational hypertension and need to be delivered at 37  weeks via repeat C-section. ----------------------------------------------------------------------                 Braxton Feathers, DO Electronically Signed Final Report   05/10/2023 05:35 pm ----------------------------------------------------------------------   Assessment:  Lynn Bean is a 37 y.o. G41P0101 female at [redacted]w[redacted]d with gestational HTN.   Plan:  1. Admit to Labor & Delivery; consents reviewed and obtained - Dr. Jean Rosenthal notified of admission - Pt is aware unless she develops  pre-eclampsia with severe features throughout the day, she will not be induced until [redacted]w[redacted]d  2. Fetal Well being  - Fetal Tracing: Category I tracing, NST q shift is appropriate - Group B Streptococcus ppx indicated: n/a, GBS negative - Presentation: vertex confirmed by Le[place]olds   3. Routine OB: - Prenatal labs reviewed, as above - Rh pos - CBC, T&S, RPR on admit - Clear fluids, saline lock  4. Induction of Labor starting at midnight -  Contractions irregular, external toco in place -  Hx of previous cesarean section for failure to progress/kidney failure from pre-eclampsia with severe features, patient is aware this baby is measuring bigger than previous baby but still requests TOLAC. -  Plan for induction with Foley catheter and pitocin, previous cesarean section so no cytotec -  Plan for NST q shift until midnight, then continuous fetal monitoring  -  Maternal pain control as desired;  - Anticipate vaginal delivery  5. Post Partum Planning: - Infant feeding: breastfeeding - Contraception: undecided - Tdap: received AP  Janyce Llanos, CNM 05/17/23 8:53 AM

## 2023-05-18 LAB — CBC
HCT: 42.3 % (ref 36.0–46.0)
Hemoglobin: 13.5 g/dL (ref 12.0–15.0)
MCH: 26.9 pg (ref 26.0–34.0)
MCHC: 31.9 g/dL (ref 30.0–36.0)
MCV: 84.3 fL (ref 80.0–100.0)
Platelets: 260 10*3/uL (ref 150–400)
RBC: 5.02 MIL/uL (ref 3.87–5.11)
RDW: 14.4 % (ref 11.5–15.5)
WBC: 14.9 10*3/uL — ABNORMAL HIGH (ref 4.0–10.5)
nRBC: 0 % (ref 0.0–0.2)

## 2023-05-18 LAB — COMPREHENSIVE METABOLIC PANEL
ALT: 17 U/L (ref 0–44)
AST: 19 U/L (ref 15–41)
Albumin: 3.2 g/dL — ABNORMAL LOW (ref 3.5–5.0)
Alkaline Phosphatase: 110 U/L (ref 38–126)
Anion gap: 10 (ref 5–15)
BUN: 10 mg/dL (ref 6–20)
CO2: 21 mmol/L — ABNORMAL LOW (ref 22–32)
Calcium: 9.7 mg/dL (ref 8.9–10.3)
Chloride: 105 mmol/L (ref 98–111)
Creatinine, Ser: 0.75 mg/dL (ref 0.44–1.00)
GFR, Estimated: >60 mL/min (ref >60)
Glucose, Bld: 90 mg/dL (ref 70–99)
Potassium: 3.6 mmol/L (ref 3.5–5.1)
Sodium: 136 mmol/L (ref 135–145)
Total Bilirubin: 0.4 mg/dL (ref 0.3–1.2)
Total Protein: 6.5 g/dL (ref 6.5–8.1)

## 2023-05-18 LAB — TYPE AND SCREEN
ABO/RH(D): B POS
Antibody Screen: NEGATIVE

## 2023-05-18 LAB — PROTEIN / CREATININE RATIO, URINE
Creatinine, Urine: 68 mg/dL
Protein Creatinine Ratio: 0.12 mg/mg{Cre} (ref 0.00–0.15)
Total Protein, Urine: 8 mg/dL

## 2023-05-18 MED ORDER — OXYTOCIN-SODIUM CHLORIDE 30-0.9 UT/500ML-% IV SOLN
1.0000 m[IU]/min | INTRAVENOUS | Status: DC
  Administered 2023-05-18 – 2023-05-19 (×2): 2 m[IU]/min via INTRAVENOUS

## 2023-05-18 MED ORDER — MISOPROSTOL 200 MCG PO TABS
ORAL_TABLET | ORAL | Status: AC
  Filled 2023-05-18: qty 4

## 2023-05-18 MED ORDER — AMMONIA AROMATIC IN INHA
RESPIRATORY_TRACT | Status: AC
  Filled 2023-05-18: qty 10

## 2023-05-18 MED ORDER — TERBUTALINE SULFATE 1 MG/ML IJ SOLN: 0.2500 mg | Freq: Once | INTRAMUSCULAR | Status: DC | PRN

## 2023-05-18 MED ORDER — OXYTOCIN 10 UNIT/ML IJ SOLN
INTRAMUSCULAR | Status: AC
  Filled 2023-05-18: qty 2

## 2023-05-18 NOTE — H&P (Signed)
OB History & Physical   History of Present Illness:   Chief Complaint: IOL TOLAC, GHTN, Macrosomia  HPI:  Lynn Bean is a 37 y.o. G42P0101 female at [redacted]w[redacted]d, Patient's last menstrual period was 09/01/2022., consistent with [place]  with Estimated Date of Delivery: 06/08/23.  She presents to L&D for [facility]  Reports active fetal movement  Contractions: every 1 to 4 minutes LOF/SROM: intact Vaginal bleeding: none  Factors complicating pregnancy:  Hx of C-Section Hx of preclampsia with SF Obesity Hx of CHTN AMA Family history of spina bifida  macrosomia  Patient Active Problem List   Diagnosis Date Noted   Gestational HTN 05/17/2023   Hx of preeclampsia, prior pregnancy, currently pregnant, third trimester 05/04/2023   AMA (advanced maternal age) multigravida 35+ 05/04/2023   Previous cesarean delivery, antepartum 05/04/2023   LGA (large for gestational age) fetus affecting management of mother 05/04/2023   Obesity in pregnancy, antepartum 03/08/2023   Supervision of high risk pregnancy in second trimester 11/30/2022   Gestational hypertension 06/20/2019   Family history of spina bifida     Prenatal Transfer Tool  Maternal Diabetes: No Genetic Screening: Declined Maternal Ultrasounds/Referrals: Normal Fetal Ultrasounds or other Referrals:  Referred to Materal Fetal Medicine  Maternal Substance Abuse:  No Significant Maternal Medications:  None Significant Maternal Lab Results: Group B Strep negative  Maternal Medical History:   Past Medical History:  Diagnosis Date   Acne    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    Completed See Immunization list   Pregnancy induced hypertension    Ruptured disc, thoracic 2007    Past Surgical History:  Procedure Laterality Date   CESAREAN SECTION N/A 06/29/2019   Procedure: CESAREAN SECTION;  Surgeon: Schermerhorn, Ihor Austin, MD;  Location: [facility] ORS;  Service: Obstetrics;  Laterality: N/A;    Allergies  Allergen  Reactions   Benadryl [Diphenhydramine]     Only brand name benadryl    Prior to Admission medications   Medication Sig Start Date End Date Taking? Authorizing Provider  acetaminophen (TYLENOL) 500 MG tablet Take 2 tablets (1,000 mg total) by mouth 4 (four) times daily. 07/02/19   McVey, Prudencio Pair, CNM  ascorbic acid (VITAMIN C) 500 MG tablet Take 500 mg by mouth daily.    [provider]  aspirin EC 81 MG tablet Take 81 mg by mouth daily. Swallow whole.    [provider]  cholecalciferol (VITAMIN D3) 25 MCG (1000 UNIT) tablet Take 1,000 Units by mouth daily.    [provider]  magnesium (MAGTAB) 84 MG ( ) TBCR SR tablet Take 200 mg by mouth daily.    [provider]  Prenatal Vit-Fe Fumarate-FA (PRENATAL MULTIVITAMIN) TABS tablet Take 1 tablet by mouth daily at 12 noon.    [provider]     Prenatal care site:  [facility]  OB History  Gravida Para Term Preterm AB Living  2 1 0 1 0 1  SAB IAB Ectopic Multiple Live Births  0 0 0 0 1    # Outcome Date GA Lbr Len/2nd Weight Sex Type Anes PTL Lv  2 Current           1 Preterm 06/29/19 [redacted]w[redacted]d  3180 g F CS-LTranv EPI  LIV     Name: Lynn Bean     Apgar1: 6  Apgar5: 8     Social History: She  reports that she has never smoked. She has never used smokeless tobacco. She reports that  she does not currently use alcohol. She reports that she does not use drugs.  Family History: family history includes Hypertension in her father; Lymphoma in her paternal grandfather; Prostate cancer in her paternal grandfather.   Review of Systems: A full review of systems was performed and negative except as noted in the HPI.     Physical Exam:  Vital Signs: BP (!) 148/98 (BP Location: Left Arm)   Pulse 90   Temp 98 F (36.7 C) (Oral)   Resp 18   Ht 5\' 6"  (1.676 m)   Wt 108.9 kg   LMP 09/01/2022   BMI 38.74 kg/m   General: no acute distress.  HEENT: normocephalic,  atraumatic Heart: regular rate & rhythm Lungs: normal respiratory effort Abdomen: soft, gravid, non-tender;  EFW: 3077 g on 04/26/23 Pelvic:   External: Normal external female genitalia  Cervix: Dilation: 3 / Effacement (%): 30, 40 / Station: Ballotable    Extremities: non-tender, symmetric,  edema bilaterally.  DTRs: +2  Neurologic: Alert & oriented x 3.    Results for orders placed or performed during the hospital encounter of 05/16/23 (from the past 24 hour(s))  CBC     Status: Abnormal   Collection Time: 05/17/23 11:55 PM  Result Value Ref Range   WBC 14.9 (H) 4.0 - 10.5 K/uL   RBC 5.02 3.87 - 5.11 MIL/uL   Hemoglobin 13.5 12.0 - 15.0 g/dL   HCT 01.0 27.2 - 53.6 %   MCV 84.3 80.0 - 100.0 fL   MCH 26.9 26.0 - 34.0 pg   MCHC 31.9 30.0 - 36.0 g/dL   RDW 64.4 03.4 - 74.2 %   Platelets 260 150 - 400 K/uL   nRBC 0.0 0.0 - 0.2 %  Comprehensive metabolic panel     Status: Abnormal   Collection Time: 05/17/23 11:55 PM  Result Value Ref Range   Sodium 136 135 - 145 mmol/L   Potassium 3.6 3.5 - 5.1 mmol/L   Chloride 105 98 - 111 mmol/L   CO2 21 (L) 22 - 32 mmol/L   Glucose, Bld 90 70 - 99 mg/dL   BUN 10 6 - 20 mg/dL   Creatinine, Ser 5.95 0.44 - 1.00 mg/dL   Calcium 9.7 8.9 - 63.8 mg/dL   Total Protein 6.5 6.5 - 8.1 g/dL   Albumin 3.2 (L) 3.5 - 5.0 g/dL   AST 19 15 - 41 U/L   ALT 17 0 - 44 U/L   Alkaline Phosphatase 110 38 - 126 U/L   Total Bilirubin 0.4 0.3 - 1.2 mg/dL   GFR, Estimated >75 >64 mL/min   Anion gap 10 5 - 15  Type and screen     Status: None   Collection Time: 05/18/23  1:08 AM  Result Value Ref Range   ABO/RH(D) B POS    Antibody Screen NEG    Sample Expiration      05/21/2023,2359 Performed at [facility], [address] [place], [place] [address]   Protein / creatinine ratio, urine     Status: None   Collection Time: 05/18/23 10:53 AM  Result Value Ref Range   Creatinine, Urine 68 mg/dL   Total Protein, Urine 8 mg/dL   Protein Creatinine  Ratio 0.12 0.00 - 0.15 mg/mg[Cre]    Pertinent Results:  Prenatal Labs: Blood type/Rh B POS   Antibody screen Negative    Rubella Immune (02/13 0000)   Varicella Immune  RPR Nonreactive (07/10 0000)   HBsAg Negative (02/13 0000)  Hep C NR   HIV Non-reactive (07/10 0000)   GC neg  Chlamydia neg  Genetic screening cfDNA declined  1 hour GTT 94,106  3 hour GTT N/a  GBS Negative/-- (07/09 0000)    FHT:  FHR: 145 bpm, variability: moderate,  accelerations:  Present,  decelerations:  Absent Category/reactivity:  Category I UC:   regular, every 1-4 minutes   Cephalic by [place]  [place] MFM OB DETAIL +14 WK  Result Date: 05/10/2023 ----------------------------------------------------------------------  OBSTETRICS REPORT                       (Signed Final 05/10/2023 05:35 pm) ---------------------------------------------------------------------- Patient Info  ID #:       [id number]                          D.O.B.:  1986/06/02 (36 yrs)  Name:       Lynn Bean               Visit Date: 05/10/2023 01:27 pm ---------------------------------------------------------------------- Performed By  Attending:        Braxton Feathers DO       Ref. Address:     [facility]                                                             [address]                                                             [place], [place],                                                             [place] [address]  Performed By:     Emeline Darling BS,      Location[facility]                                     [facility]                                                             [facility]  Referred By:      Haroldine Laws ---------------------------------------------------------------------- Orders  #  Description                           Code        Ordered By  1  [place] MFM OB DETAIL +14 WK               76811.01    JENNIFER  OXLEY  2  [place] MFM FETAL BPP WO NON               76819.01    JENNIFER OXLEY     STRESS ----------------------------------------------------------------------  #  Order #                     Accession #                Episode #  1  [id number]                   [id number]                 [id number]  2  [id number]                   [id number]                 [id number] ---------------------------------------------------------------------- Indications  Large for gestational age fetus affecting      O36.60X0  management of mother  Elevated blood pressure affecting pregnancy    O13.3  in third trimester  Poor obstetric history: Previous               O09.299  preeclampsia / eclampsia/gestational HTN  Poor obstetric history: Previous preterm       O09.219  delivery, antepartum (36 wks 2 d)  Advanced maternal age multigravida 30+,        O73.523  third trimester (36 yrs)  History of cesarean delivery, currently        O57.219  pregnant  [redacted] weeks gestation of pregnancy                Z3A.35 ---------------------------------------------------------------------- Vital Signs  BP:          144/78 ---------------------------------------------------------------------- Fetal Evaluation  Num Of Fetuses:         1  Fetal Heart Rate(bpm):  146  Cardiac Activity:       Observed  Presentation:           Cephalic  Placenta:               Anterior  P. Cord Insertion:      Not well visualized  Amniotic Fluid  AFI FV:      Within normal limits  AFI Sum(cm)     %Tile       Largest Pocket(cm)  22.03           83          7.84  RUQ(cm)       RLQ(cm)       LUQ(cm)        LLQ(cm)  3.28          4.84          7.84           6.07 ---------------------------------------------------------------------- Biophysical Evaluation  Amniotic F.V:   Pocket => 2 cm  F. Tone:        Observed  F. Movement:    Observed                   Score:          8/8  F. Breathing:   Observed ----------------------------------------------------------------------  Biometry  BPD:      97.4  mm     G. Age:  39w 6d       > 99  %    CI:        79.61   %    70 - 86                                                          FL/HC:      21.0   %    20.1 - 22.1  HC:       345   mm     G. Age:  39w 6d         96  %    HC/AC:      0.93        0.93 - 1.11  AC:      371.2  mm     G. Age:  41w 0d       > 99  %    FL/BPD:     74.4   %    71 - 87  FL:       72.5  mm     G. Age:  37w 1d         77  %    FL/AC:      19.5   %    20 - 24  Est. FW:    3958  gm    8 lb 12 oz    > 99  % ---------------------------------------------------------------------- OB History  Gravidity:    2         Term:   0        Prem:   1        SAB:   0  TOP:          0       Ectopic:  0        Living: 1 ---------------------------------------------------------------------- Gestational Age  LMP:           35w 6d        Date:  09/01/22                 EDD:   06/08/23  U/S Today:     39w 3d                                        EDD:   05/14/23  Best:          35w 6d     Det. By:  LMP  (09/01/22)          EDD:   06/08/23 ---------------------------------------------------------------------- Anatomy  Cranium:               Appears normal         Aortic Arch:            Not  well visualized  Cavum:                 Appears normal         Ductal Arch:            Not well visualized  Ventricles:            Appears normal         Diaphragm:              Appears normal  Choroid Plexus:        Appears normal         Stomach:                Appears normal, left                                                                        sided  Cerebellum:            Not well visualized    Abdomen:                Appears normal  Posterior Fossa:       Not well visualized    Abdominal Wall:         Appears nml (cord                                                                        insert, abd wall)  Nuchal Fold:           Not applicable (>20    Cord Vessels:           Not well visualized                         wks GA)  Face:                   Appears normal         Kidneys:                Appear normal                         (orbits and profile)  Lips:                  Appears normal         Bladder:                Appears normal  Thoracic:              Appears normal         Spine:                  Not well visualized  Heart:                 Appears normal         Upper Extremities:      Not well  visualized                         (4CH, axis, and                         situs)  RVOT:                  Appears normal         Lower Extremities:      Visualized  LVOT:                  Appears normal  Other:  Technically difficult due to advanced gestational age. 3VV visualized. ---------------------------------------------------------------------- Cervix Uterus Adnexa  Cervix  Not visualized (advanced GA >24wks) ---------------------------------------------------------------------- Comments  MFM CONSULT  Roxane L Manship is a 37 y.o. G2P0101 at [redacted]w[redacted]d here for  ultrasound and consultation.  The patient was referred here for a large for gestational age  fetus.  The fetal weight is at the greater than the 99th  percentile.  I discussed the concern for fetal macrosomia.  Notably her blood pressure was elevated today at 142/87.  And 144/78.  I discussed my concern for potential  preeclampsia especially given her history.  She reports that  she feels fine and denies any headaches, vision changes  right upper quadrant pain.  She did verbalize that she  preferred to go to [facility] to be assessed since she lives  in that area.  Since she has been asymptomatic I instructed  her that it is reasonable to drive to [facility] for assessment  at L&D.  Sonographic findings  Single intrauterine pregnancy at 35w 6d.  Fetal cardiac activity:  Observed and appears normal.  Presentation: Cephalic.  The anatomic structures that were well seen appear normal  without evidence of soft markers. Due to poor acoustic  windows some structures remain suboptimally visualized.   Fetal biometry shows the estimated fetal weight at the > 99  percentile and the abdominal circumference at the >99th  percentile.  Amniotic fluid:  MVP: 7.84 cm.  Placenta: Anterior.  Adnexa:No masses visualized.  BPP 8/8.  Assessment  Elevated blood pressure affecting pregnancy in third  trimester, antepartum  Hx of preeclampsia, prior pregnancy, currently pregnant, third  trimester  Multigravida of advanced maternal age in third trimester  Previous cesarean delivery, antepartum  Excessive fetal growth affecting management of pregnancy,  antepartum, single or unspecified fetus  Plan  -Sent to labor and delivery for preeclampsia rule out.  If she is  discharged she will need weekly outpatient labs (CBC, CMP),  antenatal testing and BP check  -If her blood pressure is elevated again she would rule in for  gestational hypertension and need to be delivered at 37  weeks via repeat C-section. ----------------------------------------------------------------------                 Braxton Feathers, DO Electronically Signed Final Report   05/10/2023 05:35 pm ----------------------------------------------------------------------  [place] MFM FETAL BPP WO NON STRESS  Result Date: 05/10/2023 ----------------------------------------------------------------------  OBSTETRICS REPORT                       (Signed Final 05/10/2023 05:35 pm) ---------------------------------------------------------------------- Patient Info  ID #:       [id number]                          D.O.B.:  1986/04/30 (36 yrs)  Name:       TAMELIA MICHALOWSKI               Visit Date: 05/10/2023 01:27 pm ---------------------------------------------------------------------- Performed By  Attending:        Braxton Feathers DO       Ref. Address:     [facility]                                                             [address]                                                             [place], [place],                                                             [place]  [address]  Performed By:     Emeline Darling BS,      Location:         [facility]                                     [facility]  Referred By:      Haroldine Laws ---------------------------------------------------------------------- Orders  #  Description                           Code        Ordered By  1  [place] MFM OB DETAIL +14 WK               76811.01    JENNIFER OXLEY  2  [place] MFM FETAL BPP WO NON               66063.01    JENNIFER OXLEY     STRESS ----------------------------------------------------------------------  #  Order #                     Accession #  Episode #  1  [id number]                   [id number]                 [id number]  2  [id number]                   [id number]                 [id number] ---------------------------------------------------------------------- Indications  Large for gestational age fetus affecting      O36.60X0  management of mother  Elevated blood pressure affecting pregnancy    O13.3  in third trimester  Poor obstetric history: Previous               O09.299  preeclampsia / eclampsia/gestational HTN  Poor obstetric history: Previous preterm       O09.219  delivery, antepartum (36 wks 2 d)  Advanced maternal age multigravida 84+,        O52.523  third trimester (36 yrs)  History of cesarean delivery, currently        O80.219  pregnant  [redacted] weeks gestation of pregnancy                Z3A.35 ---------------------------------------------------------------------- Vital Signs  BP:          144/78 ---------------------------------------------------------------------- Fetal Evaluation  Num Of Fetuses:         1  Fetal Heart Rate(bpm):  146  Cardiac Activity:       Observed  Presentation:           Cephalic  Placenta:               Anterior  P. Cord Insertion:      Not well visualized  Amniotic Fluid  AFI  FV:      Within normal limits  AFI Sum(cm)     %Tile       Largest Pocket(cm)  22.03           83          7.84  RUQ(cm)       RLQ(cm)       LUQ(cm)        LLQ(cm)  3.28          4.84          7.84           6.07 ---------------------------------------------------------------------- Biophysical Evaluation  Amniotic F.V:   Pocket => 2 cm             F. Tone:        Observed  F. Movement:    Observed                   Score:          8/8  F. Breathing:   Observed ---------------------------------------------------------------------- Biometry  BPD:      97.4  mm     G. Age:  39w 6d       > 99  %    CI:        79.61   %    70 - 86  FL/HC:      21.0   %    20.1 - 22.1  HC:       345   mm     G. Age:  39w 6d         96  %    HC/AC:      0.93        0.93 - 1.11  AC:      371.2  mm     G. Age:  41w 0d       > 99  %    FL/BPD:     74.4   %    71 - 87  FL:       72.5  mm     G. Age:  37w 1d         77  %    FL/AC:      19.5   %    20 - 24  Est. FW:    3958  gm    8 lb 12 oz    > 99  % ---------------------------------------------------------------------- OB History  Gravidity:    2         Term:   0        Prem:   1        SAB:   0  TOP:          0       Ectopic:  0        Living: 1 ---------------------------------------------------------------------- Gestational Age  LMP:           35w 6d        Date:  09/01/22                 EDD:   06/08/23  U/S Today:     39w 3d                                        EDD:   05/14/23  Best:          35w 6d     Det. By:  LMP  (09/01/22)          EDD:   06/08/23 ---------------------------------------------------------------------- Anatomy  Cranium:               Appears normal         Aortic Arch:            Not well visualized  Cavum:                 Appears normal         Ductal Arch:            Not well visualized  Ventricles:            Appears normal         Diaphragm:              Appears normal  Choroid Plexus:        Appears normal          Stomach:                Appears normal, left  sided  Cerebellum:            Not well visualized    Abdomen:                Appears normal  Posterior Fossa:       Not well visualized    Abdominal Wall:         Appears nml (cord                                                                        insert, abd wall)  Nuchal Fold:           Not applicable (>20    Cord Vessels:           Not well visualized                         wks GA)  Face:                  Appears normal         Kidneys:                Appear normal                         (orbits and profile)  Lips:                  Appears normal         Bladder:                Appears normal  Thoracic:              Appears normal         Spine:                  Not well visualized  Heart:                 Appears normal         Upper Extremities:      Not well visualized                         (4CH, axis, and                         situs)  RVOT:                  Appears normal         Lower Extremities:      Visualized  LVOT:                  Appears normal  Other:  Technically difficult due to advanced gestational age. 3VV visualized. ---------------------------------------------------------------------- Cervix Uterus Adnexa  Cervix  Not visualized (advanced GA >24wks) ---------------------------------------------------------------------- Comments  MFM CONSULT  Carlethia L Kinnett is a 37 y.o. G2P0101 at [redacted]w[redacted]d here for  ultrasound and consultation.  The patient was referred here for a large for gestational age  fetus.  The fetal weight is at the greater than the 99th  percentile.  I discussed the concern for fetal macrosomia.  Notably her blood  pressure was elevated today at 142/87.  And 144/78.  I discussed my concern for potential  preeclampsia especially given her history.  She reports that  she feels fine and denies any headaches, vision changes  right upper quadrant pain.  She  did verbalize that she  preferred to go to [facility] to be assessed since she lives  in that area.  Since she has been asymptomatic I instructed  her that it is reasonable to drive to [facility] for assessment  at L&D.  Sonographic findings  Single intrauterine pregnancy at 35w 6d.  Fetal cardiac activity:  Observed and appears normal.  Presentation: Cephalic.  The anatomic structures that were well seen appear normal  without evidence of soft markers. Due to poor acoustic  windows some structures remain suboptimally visualized.  Fetal biometry shows the estimated fetal weight at the > 99  percentile and the abdominal circumference at the >99th  percentile.  Amniotic fluid:  MVP: 7.84 cm.  Placenta: Anterior.  Adnexa:No masses visualized.  BPP 8/8.  Assessment  Elevated blood pressure affecting pregnancy in third  trimester, antepartum  Hx of preeclampsia, prior pregnancy, currently pregnant, third  trimester  Multigravida of advanced maternal age in third trimester  Previous cesarean delivery, antepartum  Excessive fetal growth affecting management of pregnancy,  antepartum, single or unspecified fetus  Plan  -Sent to labor and delivery for preeclampsia rule out.  If she is  discharged she will need weekly outpatient labs (CBC, CMP),  antenatal testing and BP check  -If her blood pressure is elevated again she would rule in for  gestational hypertension and need to be delivered at 37  weeks via repeat C-section. ----------------------------------------------------------------------                 Braxton Feathers, DO Electronically Signed Final Report   05/10/2023 05:35 pm ----------------------------------------------------------------------   Assessment:  Lynn Bean is a 37 y.o. G68P0101 female at [redacted]w[redacted]d with hx of c-section, GHTN, macrosomia for TOLAC.   Plan:  1. Admit to Labor & Delivery - consents reviewed and obtained - Dr. Feliberto Gottron notified of admission and plan of care   2. Fetal Well being   - Fetal Tracing: category 1 - Group B Streptococcus ppx not indicated: GBS negative - Presentation: cephalic confirmed by sve   3. Routine OB: - Prenatal labs reviewed, as above - Rh positive - CBC, T&S, RPR on admit - Clear liquid diet , continuous IV fluids  4. Induction of labor  - Contractions monitored with external toco - Pelvis adequate for trial of labor  - Plan for induction with oxytocin and cervical balloon  - Augmentation with oxytocin and AROM as appropriate  - Plan for  continuous fetal monitoring - Maternal pain control as desired; planning regional anesthesia, IVPM, and position changes  - Anticipate vaginal delivery  5. Post Partum Planning: - Infant feeding:  TBD - Contraception: bilateral tubal ligation - Tdap vaccine: Given prenatally - Flu vaccine:  not in season  Jaylenn Baiza LUCY Delmar Landau, CNM 05/18/23 5:38 PM  Chari Manning, CNM Certified Nurse Midwife [facility]

## 2023-05-18 NOTE — Progress Notes (Signed)
Spoke to Lynn Bean,  CNM.Order obtained to remove fetal monitors to respect Lynn Bean's sleep. Will obtain NST before initiation of Pitocin at 0400. Lynn Bean endorses vigorous fetal movement and states she will call RN with any concerns. RN palpated and auscultated fetal movement.

## 2023-05-18 NOTE — Progress Notes (Signed)
Patient ID: Lynn Bean, female   DOB: 09-06-1986, 37 y.o.   MRN: [id number] Following progress of IOL . CNM/RN updating me on a regular basis. CAt 1 fetal monitoring . Pt has been counseled by me about success and risk of TOLAC in [facility] office . Risk of fetal demise / uterine rupture discuss then

## 2023-05-18 NOTE — Progress Notes (Signed)
L&D Note    Subjective:  has no unusual complaints  Objective:   Vitals:   05/18/23 0730 05/18/23 0810 05/18/23 1055 05/18/23 1615  BP: 124/72 133/83 130/76 (!) 148/98  Pulse: (!) 117 (!) 107 (!) 102 90  Resp:  18 18 18   Temp:  98 F (36.7 C) 98.3 F (36.8 C) 98 F (36.7 C)  TempSrc:  Oral Oral Oral  Weight:      Height:        Current Vital Signs 24h Vital Sign Ranges  T 98 F (36.7 C) Temp  Avg: 98.1 F (36.7 C)  Min: 97.9 F (36.6 C)  Max: 98.3 F (36.8 C)  BP (!) 148/98 BP  Min: 115/72  Max: 153/79  HR 90 Pulse  Avg: 100.7  Min: 85  Max: 117  RR 18 Resp  Avg: 18  Min: 18  Max: 18  SaO2     No data recorded      Gen: alert, cooperative, no distress FHR: Baseline: 145 bpm, Variability: moderate, Accels: Present, Decels: none Toco: regular, every 1-4 minutes SVE: Dilation: 3 Effacement (%): 30, 40 Cervical Position: Posterior Station: Ballotable Presentation: Vertex Exam by:: Rubye Oaks CNM  Medications SCHEDULED MEDICATIONS   ammonia       misoprostol       oxytocin       oxytocin 40 units in LR 1000 mL  333 mL Intravenous Once   prenatal multivitamin  1 tablet Oral Q1200    MEDICATION INFUSIONS   lactated ringers     lactated ringers 125 mL/hr at 05/18/23 1714   oxytocin     oxytocin 18 milli-units/min (05/18/23 1645)    PRN MEDICATIONS  acetaminophen, ammonia, calcium carbonate, fentaNYL (SUBLIMAZE) injection, lactated ringers, lidocaine (PF), misoprostol, ondansetron, oxytocin, sodium citrate-citric acid, terbutaline   Assessment & Plan:  37 y.o. G2P0101 at [redacted]w[redacted]d admitted for TOLAC, GHTN, and Macrosomia -Labor: Early latent labor. and Satisfactory labor progress. -Fetal Well-being: Category I -GBS: negative -Membranes intact -Continue present management. -Analgesia: regional anesthesia, IVPM, and position changes  -Dr Feliberto Gottron aware of labor progress   Kent Riendeau Wonda Amis, CNM  05/18/2023 5:33 PM  [facility]

## 2023-05-18 NOTE — Progress Notes (Addendum)
Labor Progress Note  ZAILA CREW is a 37 y.o. G2P0101 at [redacted]w[redacted]d by LMP admitted for induction of labor due to gHTN.  Subjective: she is comfortable in bed, denies any contractions or pain at this time  Objective: BP 115/72 (BP Location: Right Arm)   Pulse (!) 109   Temp 98.1 F (36.7 C) (Oral)   Resp 18   Ht 5\' 6"  (1.676 m)   Wt 108.9 kg   LMP 09/01/2022   BMI 38.74 kg/m  Notable VS details: reviewed  Fetal Assessment: FHT:  FHR: 145 bpm, variability: moderate,  accelerations:  Present,  decelerations:  Absent Category/reactivity:  Category I UC:   regular SVE:    Dilation: fingertip  Effacement: Long  Station:  Floating  Consistency: firm  Position: middle  Membrane status:intact Amniotic color: n/a  Labs: Lab Results  Component Value Date   WBC 14.9 (H) 05/17/2023   HGB 13.5 05/17/2023   HCT 42.3 05/17/2023   MCV 84.3 05/17/2023   PLT 260 05/17/2023    Assessment / Plan: 37 year old G2P0101 at [redacted]w[redacted]d here for TOLAC induction for gHTN  Labor:  Discussed her cervical exam with her and that she will need cervical ripening. The primary cervical ripening we like to use, cytotec, is not safe with someone who has a previous cesarean section or scar on their uterus. We can do low-dose pitocin and Foley catheter for cervical ripening. Where her cervix is, she has a low chance of TOLAC success due to unfavorable cervix with a TOLAC induction. She verbalized understanding but would like to try for a TOLAC. Foley catheter placed with ease and inflated to 30ml. Low-dose pitocin started. Dr. Feliberto Gottron updated on plan of care. Confirmed fetal vertex with bedside US.  Preeclampsia:  labs stable Fetal Wellbeing:  Category I Pain Control:  Epidural I/D:   GBS negative Anticipated MOD:  NSVD  Janyce Llanos, CNM 05/18/2023, 8:13 AM

## 2023-05-18 NOTE — Progress Notes (Signed)
L&D Note    Subjective:  has no unusual complaints  Objective:   Vitals:   05/18/23 0627 05/18/23 0730 05/18/23 0810 05/18/23 1055  BP: 115/72 124/72 133/83 130/76  Pulse: (!) 109 (!) 117 (!) 107 (!) 102  Resp:   18 18  Temp: 98.1 F (36.7 C)  98 F (36.7 C) 98.3 F (36.8 C)  TempSrc: Oral  Oral Oral  Weight:      Height:        Current Vital Signs 24h Vital Sign Ranges  T 98.3 F (36.8 C) Temp  Avg: 98.1 F (36.7 C)  Min: 97.9 F (36.6 C)  Max: 98.3 F (36.8 C)  BP 130/76 BP  Min: 115/72  Max: 163/92  HR (!) 102 Pulse  Avg: 101.3  Min: 85  Max: 117  RR 18 Resp  Avg: 18  Min: 18  Max: 18  SaO2     No data recorded      Gen: alert, cooperative, no distress FHR: Baseline: 145 bpm, Variability: moderate, Accels: Present, Decels: none Toco: regular, every 2-3 minutes SVE: Dilation: 3 Effacement (%): 30, 40 Cervical Position: Posterior Station: Ballotable Presentation: Vertex Exam by:: Rubye Oaks CNM  Medications SCHEDULED MEDICATIONS   ammonia       misoprostol       oxytocin       oxytocin 40 units in LR 1000 mL  333 mL Intravenous Once   prenatal multivitamin  1 tablet Oral Q1200    MEDICATION INFUSIONS   lactated ringers     lactated ringers 125 mL/hr at 05/18/23 0917   oxytocin     oxytocin 12 milli-units/min (05/18/23 1423)    PRN MEDICATIONS  acetaminophen, ammonia, calcium carbonate, fentaNYL (SUBLIMAZE) injection, lactated ringers, lidocaine (PF), misoprostol, ondansetron, oxytocin, sodium citrate-citric acid, terbutaline   Assessment & Plan:  37 y.o. G2P0101 at [redacted]w[redacted]d admitted for TOLAC with GHTN and macrosomia.Cervical Foley balloon out at noon after 4 hours, currently on 12 mil/u of Pitocin. Patient appears comfortable and not feeling contractions. -Labor: Early latent labor. and Inadequate uterine activity - intensity or frequency. -Fetal Well-being: Category I -GBS: negative -Membranes intact -Intervention: IV Pitocin induction, increase Pitocin  rate, and change maternal position -Analgesia: regional anesthesia, IVPM, and position changes    Daisha Filosa Wonda Amis, CNM  05/18/2023 2:23 PM  [facility]

## 2023-05-19 ENCOUNTER — Encounter
Admission: EM | Disposition: A | Discharge status: Home / Self Care | Payer: Self-pay | Source: Home / Self Care | Attending: Obstetrics

## 2023-05-19 ENCOUNTER — Inpatient Hospital Stay: Payer: BC Managed Care – PPO | Admitting: General Practice

## 2023-05-19 LAB — COMPREHENSIVE METABOLIC PANEL
ALT: 16 U/L (ref 0–44)
AST: 19 U/L (ref 15–41)
Albumin: 2.7 g/dL — ABNORMAL LOW (ref 3.5–5.0)
Alkaline Phosphatase: 95 U/L (ref 38–126)
Anion gap: 6 (ref 5–15)
BUN: 8 mg/dL (ref 6–20)
CO2: 21 mmol/L — ABNORMAL LOW (ref 22–32)
Calcium: 8.8 mg/dL — ABNORMAL LOW (ref 8.9–10.3)
Chloride: 108 mmol/L (ref 98–111)
Creatinine, Ser: 0.74 mg/dL (ref 0.44–1.00)
GFR, Estimated: >60 mL/min (ref >60)
Glucose, Bld: 95 mg/dL (ref 70–99)
Potassium: 3.8 mmol/L (ref 3.5–5.1)
Sodium: 135 mmol/L (ref 135–145)
Total Bilirubin: 0.4 mg/dL (ref 0.3–1.2)
Total Protein: 5.9 g/dL — ABNORMAL LOW (ref 6.5–8.1)

## 2023-05-19 LAB — CBC
HCT: 36.2 % (ref 36.0–46.0)
Hemoglobin: 11.8 g/dL — ABNORMAL LOW (ref 12.0–15.0)
MCH: 26.4 pg (ref 26.0–34.0)
MCHC: 32.6 g/dL (ref 30.0–36.0)
MCV: 81 fL (ref 80.0–100.0)
Platelets: 222 10*3/uL (ref 150–400)
RBC: 4.47 MIL/uL (ref 3.87–5.11)
RDW: 14.4 % (ref 11.5–15.5)
WBC: 15.1 10*3/uL — ABNORMAL HIGH (ref 4.0–10.5)
nRBC: 0 % (ref 0.0–0.2)

## 2023-05-19 LAB — PROTEIN / CREATININE RATIO, URINE
Creatinine, Urine: 55 mg/dL
Total Protein, Urine: <6 mg/dL

## 2023-05-19 SURGERY — Surgical Case
Anesthesia: Epidural

## 2023-05-19 MED ORDER — BUPIVACAINE HCL (PF) 0.5 % IJ SOLN
INTRAMUSCULAR | Status: DC | PRN
  Administered 2023-05-19: 60 mL

## 2023-05-19 MED ORDER — BUPIVACAINE HCL (PF) 0.25 % IJ SOLN
INTRAMUSCULAR | Status: DC | PRN
  Administered 2023-05-19 (×2): 4 mL via EPIDURAL

## 2023-05-19 MED ORDER — FENTANYL CITRATE (PF) 100 MCG/2ML IJ SOLN
INTRAMUSCULAR | Status: DC | PRN
  Administered 2023-05-19: 100 ug via EPIDURAL

## 2023-05-19 MED ORDER — GABAPENTIN 300 MG PO CAPS
ORAL_CAPSULE | ORAL | Status: AC
  Administered 2023-05-19: 300 mg via ORAL
  Filled 2023-05-19: qty 1

## 2023-05-19 MED ORDER — LIDOCAINE HCL (PF) 1 % IJ SOLN
INTRAMUSCULAR | Status: DC | PRN
  Administered 2023-05-19: 3 mL

## 2023-05-19 MED ORDER — OXYTOCIN-SODIUM CHLORIDE 30-0.9 UT/500ML-% IV SOLN
INTRAVENOUS | Status: AC
  Filled 2023-05-19: qty 500

## 2023-05-19 MED ORDER — BUPIVACAINE HCL (PF) 0.5 % IJ SOLN
INTRAMUSCULAR | Status: AC
  Filled 2023-05-19: qty 60

## 2023-05-19 MED ORDER — PHENYLEPHRINE HCL (PRESSORS) 10 MG/ML IV SOLN
INTRAVENOUS | Status: DC | PRN
  Administered 2023-05-19: 80 ug via INTRAVENOUS
  Administered 2023-05-19: 160 ug via INTRAVENOUS
  Administered 2023-05-19: 80 ug via INTRAVENOUS

## 2023-05-19 MED ORDER — SODIUM CHLORIDE 0.9 % IV SOLN
INTRAVENOUS | Status: AC
  Filled 2023-05-19: qty 5

## 2023-05-19 MED ORDER — METHYLERGONOVINE MALEATE 0.2 MG/ML IJ SOLN
INTRAMUSCULAR | Status: AC
  Filled 2023-05-19: qty 1

## 2023-05-19 MED ORDER — 0.9 % SODIUM CHLORIDE (POUR BTL) OPTIME
TOPICAL | Status: DC | PRN
  Administered 2023-05-19: 1000 mL

## 2023-05-19 MED ORDER — PHENYLEPHRINE 80 MCG/ML (10ML) SYRINGE FOR IV PUSH (FOR BLOOD PRESSURE SUPPORT): 80.0000 ug | PREFILLED_SYRINGE | INTRAVENOUS | Status: DC | PRN

## 2023-05-19 MED ORDER — SODIUM BICARBONATE 8.4 % IV SOLN
INTRAVENOUS | Status: AC
  Filled 2023-05-19: qty 50

## 2023-05-19 MED ORDER — SOD CITRATE-CITRIC ACID 500-334 MG/5ML PO SOLN
ORAL | Status: AC
  Filled 2023-05-19: qty 15

## 2023-05-19 MED ORDER — LIDOCAINE HCL (PF) 2 % IJ SOLN
INTRAMUSCULAR | Status: AC
  Filled 2023-05-19: qty 20

## 2023-05-19 MED ORDER — MORPHINE SULFATE (PF) 0.5 MG/ML IJ SOLN
INTRAMUSCULAR | Status: AC
  Filled 2023-05-19: qty 10

## 2023-05-19 MED ORDER — SODIUM BICARBONATE 8.4 % IV SOLN
INTRAVENOUS | Status: DC | PRN
  Administered 2023-05-19: 2 meq

## 2023-05-19 MED ORDER — FENTANYL CITRATE (PF) 100 MCG/2ML IJ SOLN
INTRAMUSCULAR | Status: AC
  Filled 2023-05-19: qty 2

## 2023-05-19 MED ORDER — GABAPENTIN 300 MG PO CAPS: 300.0000 mg | ORAL_CAPSULE | Freq: Once | ORAL | Status: AC

## 2023-05-19 MED ORDER — EPHEDRINE 5 MG/ML INJ: 10.0000 mg | INTRAVENOUS | Status: DC | PRN

## 2023-05-19 MED ORDER — ACETAMINOPHEN 500 MG PO TABS: 1000.0000 mg | ORAL_TABLET | Freq: Once | ORAL | Status: DC

## 2023-05-19 MED ORDER — FENTANYL-BUPIVACAINE-NACL 0.5-0.125-0.9 MG/250ML-% EP SOLN: 12.0000 mL/h | EPIDURAL | Status: DC | PRN

## 2023-05-19 MED ORDER — LIDOCAINE-EPINEPHRINE (PF) 1.5 %-1:200000 IJ SOLN
INTRAMUSCULAR | Status: DC | PRN
  Administered 2023-05-19: 3 mL via EPIDURAL

## 2023-05-19 MED ORDER — DIPHENHYDRAMINE HCL 50 MG/ML IJ SOLN: 12.5000 mg | INTRAMUSCULAR | Status: DC | PRN

## 2023-05-19 MED ORDER — FENTANYL-BUPIVACAINE-NACL 0.5-0.125-0.9 MG/250ML-% EP SOLN
EPIDURAL | Status: AC
  Filled 2023-05-19: qty 250

## 2023-05-19 MED ORDER — FAMOTIDINE 20 MG PO TABS
ORAL_TABLET | ORAL | Status: AC
  Administered 2023-05-19: 20 mg
  Filled 2023-05-19: qty 1

## 2023-05-19 MED ORDER — SODIUM CHLORIDE 0.9 % IV SOLN
500.0000 mg | INTRAVENOUS | Status: DC
  Administered 2023-05-19: 500 mg via INTRAVENOUS
  Filled 2023-05-19: qty 5

## 2023-05-19 MED ORDER — PHENYLEPHRINE HCL-NACL 20-0.9 MG/250ML-% IV SOLN
INTRAVENOUS | Status: AC
  Filled 2023-05-19: qty 250

## 2023-05-19 MED ORDER — CEFAZOLIN SODIUM-DEXTROSE 2-4 GM/100ML-% IV SOLN
2.0000 g | Freq: Once | INTRAVENOUS | Status: AC
  Administered 2023-05-19: 2 g via INTRAVENOUS

## 2023-05-19 MED ORDER — LACTATED RINGERS IV SOLN: 500.0000 mL | Freq: Once | INTRAVENOUS | Status: DC

## 2023-05-19 MED ORDER — LIDOCAINE HCL (PF) 2 % IJ SOLN
INTRAMUSCULAR | Status: DC | PRN
  Administered 2023-05-19 (×5): 100 mg via EPIDURAL

## 2023-05-19 MED ORDER — CEFAZOLIN SODIUM-DEXTROSE 2-4 GM/100ML-% IV SOLN
INTRAVENOUS | Status: AC
  Filled 2023-05-19: qty 100

## 2023-05-19 MED ORDER — MORPHINE SULFATE (PF) 0.5 MG/ML IJ SOLN
INTRAMUSCULAR | Status: DC | PRN
  Administered 2023-05-19: 2.5 mg via EPIDURAL

## 2023-05-19 SURGICAL SUPPLY — 35 items
APL PRP STRL LF DISP 70% ISPRP (MISCELLANEOUS) ×1
BARRIER ADHS 3X4 INTERCEED (GAUZE/BANDAGES/DRESSINGS) ×1 IMPLANT
BRR ADH 4X3 ABS CNTRL BYND (GAUZE/BANDAGES/DRESSINGS) ×1
CHLORAPREP W/TINT 26 (MISCELLANEOUS) ×1 IMPLANT
DRSG TELFA 3X8 NADH STRL (GAUZE/BANDAGES/DRESSINGS) ×1 IMPLANT
ELECT CAUTERY BLADE 6.4 (BLADE) ×1 IMPLANT
ELECT REM PT RETURN 9FT ADLT (ELECTROSURGICAL) ×1
ELECTRODE REM PT RTRN 9FT ADLT (ELECTROSURGICAL) ×1 IMPLANT
GAUZE SPONGE 4X4 12PLY STRL (GAUZE/BANDAGES/DRESSINGS) ×1 IMPLANT
GLOVE SURG SYN 8.0 (GLOVE) ×1 IMPLANT
GLOVE SURG SYN 8.0 PF PI (GLOVE) ×1 IMPLANT
GOWN STRL REUS W/ TWL LRG LVL3 (GOWN DISPOSABLE) ×2 IMPLANT
GOWN STRL REUS W/ TWL XL LVL3 (GOWN DISPOSABLE) ×1 IMPLANT
GOWN STRL REUS W/TWL LRG LVL3 (GOWN DISPOSABLE) ×2
GOWN STRL REUS W/TWL XL LVL3 (GOWN DISPOSABLE) ×1
MANIFOLD NEPTUNE II (INSTRUMENTS) ×1 IMPLANT
MAT PREVALON FULL STRYKER (MISCELLANEOUS) ×1 IMPLANT
NDL HYPO 22X1.5 SAFETY MO (MISCELLANEOUS) ×1 IMPLANT
NEEDLE HYPO 22X1.5 SAFETY MO (MISCELLANEOUS) ×1 IMPLANT
NS IRRIG 1000ML POUR BTL (IV SOLUTION) ×1 IMPLANT
PACK C SECTION AR (MISCELLANEOUS) ×1 IMPLANT
PAD OB MATERNITY 4.3X12.25 (PERSONAL CARE ITEMS) ×1 IMPLANT
PAD PREP OB/GYN DISP 24X41 (PERSONAL CARE ITEMS) ×1 IMPLANT
SCRUB CHG 4% DYNA-HEX 4OZ (MISCELLANEOUS) ×1 IMPLANT
STRAP SAFETY 5IN WIDE (MISCELLANEOUS) ×1 IMPLANT
SUT CHROMIC 1 CTX 36 (SUTURE) ×3 IMPLANT
SUT CHROMIC 2 0 CT 1 (SUTURE) IMPLANT
SUT MNCRL 4-0 (SUTURE) ×1
SUT MNCRL 4-0 27XMFL (SUTURE) ×1
SUT PLAIN GUT 0 (SUTURE) ×2 IMPLANT
SUT VIC AB 0 CT1 36 (SUTURE) ×2 IMPLANT
SUTURE MNCRL 4-0 27XMF (SUTURE) IMPLANT
SYR 30ML LL (SYRINGE) ×2 IMPLANT
TRAP FLUID SMOKE EVACUATOR (MISCELLANEOUS) ×1 IMPLANT
WATER STERILE IRR 500ML POUR (IV SOLUTION) ×1 IMPLANT

## 2023-05-19 NOTE — Progress Notes (Signed)
S: Pt is still feeling intense UCs while epidural sets up  O: FHT - baseline: 140bpm, accels absent, decels resolved UCs - 1.5-2 mins  A:  Lynn Bean is a 37 y.o.G2P0101 female at [redacted]w[redacted]d with gestational HTN. She is here for a TOLAC IOL.  P:  Cut Pitocin down to 8mU  Jenifer E Manie Bealer 05/19/2023 1:49 PM

## 2023-05-19 NOTE — Anesthesia Preprocedure Evaluation (Addendum)
Anesthesia Evaluation  Patient identified by MRN, date of birth, ID band Patient awake    Reviewed: Allergy & Precautions, H&P , NPO status , Patient's Chart, lab work & pertinent test results  Airway Mallampati: III  TM Distance: >3 FB     Dental no notable dental hx.    Pulmonary    Pulmonary exam normal        Cardiovascular hypertension, Normal cardiovascular exam     Neuro/Psych negative neurological ROS  negative psych ROS   GI/Hepatic Neg liver ROS,GERD  ,,  Endo/Other  negative endocrine ROS    Renal/GU negative Renal ROS  negative genitourinary   Musculoskeletal   Abdominal   Peds  Hematology negative hematology ROS (+)   Anesthesia Other Findings 37 yo G2P0101 at 25 1/7 presenting for repeat c-section for failure to progress.  Reproductive/Obstetrics (+) Pregnancy                              Anesthesia Physical Anesthesia Plan  ASA: 2  Anesthesia Plan: Epidural   Post-op Pain Management:    Induction:   PONV Risk Score and Plan: 2 and Ondansetron and Treatment may vary due to age or medical condition  Airway Management Planned: Natural Airway  Additional Equipment:   Intra-op Plan:   Post-operative Plan:   Informed Consent: I have reviewed the patients History and Physical, chart, labs and discussed the procedure including the risks, benefits and alternatives for the proposed anesthesia with the patient or authorized representative who has indicated his/her understanding and acceptance.     Dental Advisory Given  Plan Discussed with: Anesthesiologist and CRNA  Anesthesia Plan Comments: (Patient reports no bleeding problems and no anticoagulant use.   Patient consented for risks of anesthesia including but not limited to:  - adverse reactions to medications - risk of bleeding, infection and or nerve damage from epidural that could lead to paralysis - risk  of headache or failed epidural - nerve damage due to positioning - that if epidural is used for C-section that there is a chance of epidural failure requiring spinal placement or conversion to GA - damage to heart, brain, lungs, other parts of body or loss of life  Patient voiced understanding.)        Anesthesia Quick Evaluation

## 2023-05-19 NOTE — Progress Notes (Signed)
Labor Progress Note  Lynn Bean is a 37 y.o. G2P0101 at [redacted]w[redacted]d by LMP admitted for induction of labor due to Hypertension.  Subjective: Pt is becoming more comfortable  Objective: BP 92/79   Pulse 91   Temp 97.8 F (36.6 C) (Oral)   Resp 18   Ht 5\' 6"  (1.676 m)   Wt 108.9 kg   LMP 09/01/2022   SpO2 100%   BMI 38.74 kg/m    Fetal Assessment: FHT:  FHR: 120 bpm, variability: moderate,  accelerations:  Present,  decelerations:  Absent Category/reactivity:  Category I UC:   regular, every 2-4 minutes SVE:    Dilation: 8cm  Effacement: 100%  Station:  -1  Consistency: soft  Position: anterior  Membrane status: AROM at 0839 Amniotic color: Clear  Labs: Lab Results  Component Value Date   WBC 15.1 (H) 05/19/2023   HGB 11.8 (L) 05/19/2023   HCT 36.2 05/19/2023   MCV 81.0 05/19/2023   PLT 222 05/19/2023    Assessment / Plan: Induction of labor due to gestational hypertension,  progressing well on pitocin 2150 2.5/30;40/high 0809 2.5/30;40/high 0839 AROM and IUPC   1.5/50/-3 1043 1.5/50/-3 1331 5/100/-2 1306 Epidural placed 1342 Pitocin reduced to 8mU 1402 8/100/-1 Pitocin currently 10mU and MVUs > 200   Labor: Progressing normally Preeclampsia:   92/79 Fetal Wellbeing:  Category I  Pain Control:  Epidural I/D:   Afebrile, GBS neg, AROM x6 hrs  Anticipated MOD:  NSVD  Cyril Mourning, CNM 05/19/2023, 3:29 PM

## 2023-05-19 NOTE — Progress Notes (Addendum)
Labor Progress Note  Lynn Bean is a 37 y.o. G2P0101 at [redacted]w[redacted]d by LMP admitted for induction of labor due to Hypertension.  Subjective: Pt requesting pain medication  Objective: BP 136/87 (BP Location: Left Arm)   Pulse 90   Temp 97.9 F (36.6 C) (Oral)   Resp 18   Ht 5\' 6"  (1.676 m)   Wt 108.9 kg   LMP 09/01/2022   BMI 38.74 kg/m    Fetal Assessment: FHT:  FHR: 140 bpm, variability: moderate,  accelerations:  Present,  decelerations:  Absent Category/reactivity:  Category I UC:   regular, every 2 minutes SVE:    Dilation: 2cm  Effacement: 30%  Station:  Floating  Consistency: medium  Position: posterior  Membrane status: AROM Amniotic color: n/a  Labs: Lab Results  Component Value Date   WBC 15.1 (H) 05/19/2023   HGB 11.8 (L) 05/19/2023   HCT 36.2 05/19/2023   MCV 81.0 05/19/2023   PLT 222 05/19/2023    Assessment / Plan: Induction of labor due to gestational hypertension,  progressing well on pitocin 2150 2.5/30;40/high 0735 Pitocin 14mU 0809 2.5/30;40/high 0839 AROM and IUPC   1.5/50/-3 1043 1.5/50/-3 Pitocin currently 20mU and >200  Labor: Progressing on Pitocin, will continue to increase then AROM Preeclampsia:   136/87 Fetal Wellbeing:  Category I Pain Control:  Labor support without medications and IV pain meds I/D:   Afebrile, GBS neg, AROM Anticipated MOD:  NSVD  Cyril Mourning, CNM 05/19/2023, 10:48 AM

## 2023-05-19 NOTE — Anesthesia Procedure Notes (Signed)
Epidural Patient location during procedure: OB Start time: 05/19/2023 1:00 PM End time: 05/19/2023 1:06 PM  Staffing Anesthesiologist: Stephanie Coup, MD Resident/CRNA: Irving Burton, CRNA Performed: resident/CRNA   Preanesthetic Checklist Completed: patient identified, IV checked, site marked, risks and benefits discussed, surgical consent, monitors and equipment checked, pre-op evaluation and timeout performed  Epidural Patient position: sitting Prep: ChloraPrep Patient monitoring: heart rate, continuous pulse ox and blood pressure Approach: midline Location: L3-L4 Injection technique: LOR air  Needle:  Needle type: Tuohy  Needle gauge: 17 G Needle length: 9 cm and 9 Needle insertion depth: 8 cm Catheter type: closed end flexible Catheter size: 19 Gauge Catheter at skin depth: 12 cm Test dose: negative and 1.5% lidocaine with Epi 1:200 K  Assessment Events: blood not aspirated, no cerebrospinal fluid, injection not painful, no injection resistance, no paresthesia and negative IV test  Additional Notes 1 attempt Pt. Evaluated and documentation done after procedure finished. Patient identified. Risks/Benefits/Options discussed with patient including but not limited to bleeding, infection, nerve damage, paralysis, failed block, incomplete pain control, headache, blood pressure changes, nausea, vomiting, reactions to medication both or allergic, itching and postpartum back pain. Confirmed with bedside nurse the patient's most recent platelet count. Confirmed with patient that they are not currently taking any anticoagulation, have any bleeding history or any family history of bleeding disorders. Patient expressed understanding and wished to proceed. All questions were answered. Sterile technique was used throughout the entire procedure. Please see nursing notes for vital signs. Test dose was given through epidural catheter and negative prior to continuing to dose epidural or  start infusion. Warning signs of high block given to the patient including shortness of breath, tingling/numbness in hands, complete motor block, or any concerning symptoms with instructions to call for help. Patient was given instructions on fall risk and not to get out of bed. All questions and concerns addressed with instructions to call with any issues or inadequate analgesia.    Patient tolerated the insertion well without immediate complications.Reason for block:procedure for pain

## 2023-05-19 NOTE — Progress Notes (Signed)
Labor Progress Note  Lynn Bean is a 37 y.o. G2P0101 at [redacted]w[redacted]d by LMP admitted for induction of labor due to Hypertension.  Subjective: Pt waiting on relief from epidural  Objective: BP 109/67   Pulse 83   Temp 97.8 F (36.6 C) (Oral)   Resp 18   Ht 5\' 6"  (1.676 m)   Wt 108.9 kg   LMP 09/01/2022   SpO2 98%   BMI 38.74 kg/m    Fetal Assessment: FHT:  FHR: 140 bpm, variability: moderate,  accelerations:  Present,  decelerations:  Present broke strip - difficult to assess between earlies or lates - will continue to monitor Category/reactivity:  Category I UC:   regular, every 2 minutes SVE:    Dilation: 5cm  Effacement: 100%  Station:  -2  Consistency: soft  Position: anterior  Membrane status: AROM at 0839 Amniotic color: Clear  Labs: Lab Results  Component Value Date   WBC 15.1 (H) 05/19/2023   HGB 11.8 (L) 05/19/2023   HCT 36.2 05/19/2023   MCV 81.0 05/19/2023   PLT 222 05/19/2023    Assessment / Plan: Induction of labor due to gestational hypertension,  progressing well on pitocin 2150 2.5/30;40/high 0735 Pitocin 14mU 0809 2.5/30;40/high 0839 AROM and IUPC   1.5/50/-3 1043 1.5/50/-3 1331 5/100/-2 Pitocin currently 18mU    Labor: Progressing normally Preeclampsia:   109/67 Fetal Wellbeing:  Category I vs Cat II Pain Control:  Epidural I/D:   Afebrile, GBS neg, AROM x5 hrs  Anticipated MOD:  NSVD  Lynn Bean, CNM 05/19/2023, 1:34 PM

## 2023-05-19 NOTE — Progress Notes (Signed)
L&D Note    Subjective:  has no unusual complaints  Objective:   Vitals:   05/18/23 1909 05/18/23 2100 05/18/23 2240 05/19/23 0340  BP: 129/78  133/75 119/62  Pulse: 84  (!) 112 94  Resp: 18  16 16   Temp:  98.2 F (36.8 C) 98.6 F (37 C) 97.8 F (36.6 C)  TempSrc:  Oral Oral Oral  Weight:      Height:        Current Vital Signs 24h Vital Sign Ranges  T 97.8 F (36.6 C) Temp  Avg: 98.1 F (36.7 C)  Min: 97.8 F (36.6 C)  Max: 98.6 F (37 C)  BP 119/62 BP  Min: 115/72  Max: 151/85  HR 94 Pulse  Avg: 99.2  Min: 84  Max: 117  RR 16 Resp  Avg: 17.3  Min: 16  Max: 18  SaO2     No data recorded      Gen: alert, cooperative, no distress FHR: Baseline: 135 bpm, Variability: moderate, Accels: Present, Decels: none Toco: regular, every 2-3 minutes SVE: Dilation: 3 Effacement (%): 30, 40 Cervical Position: Posterior Station: Ballotable Presentation: Vertex Exam by:: [place] Guptill RN  Medications SCHEDULED MEDICATIONS   oxytocin 40 units in LR 1000 mL  333 mL Intravenous Once   prenatal multivitamin  1 tablet Oral Q1200    MEDICATION INFUSIONS   lactated ringers     lactated ringers 125 mL/hr at 05/19/23 0338   oxytocin     oxytocin 10 milli-units/min (05/19/23 0600)    PRN MEDICATIONS  acetaminophen, calcium carbonate, fentaNYL (SUBLIMAZE) injection, lactated ringers, lidocaine (PF), ondansetron, sodium citrate-citric acid, terbutaline   Assessment & Plan:  37 y.o. G2P0101 at [redacted]w[redacted]d admitted for [facility] -Labor: Early latent labor. -Fetal Well-being: Category I -GBS: negative -Membranes intact -Continue present management. and Intervention: IV Pitocin induction and increase Pitocin rate    Vidya Bamford Wonda Amis, CNM  05/19/2023 6:05 AM  [facility]

## 2023-05-19 NOTE — Progress Notes (Signed)
L&D Note    Subjective:  has no unusual complaints  Objective:   Vitals:   05/18/23 1909 05/18/23 2100 05/18/23 2240 05/19/23 0340  BP: 129/78  133/75 119/62  Pulse: 84  (!) 112 94  Resp: 18  16 16   Temp:  98.2 F (36.8 C) 98.6 F (37 C) 97.8 F (36.6 C)  TempSrc:  Oral Oral Oral  Weight:      Height:        Current Vital Signs 24h Vital Sign Ranges  T 97.8 F (36.6 C) Temp  Avg: 98.1 F (36.7 C)  Min: 97.8 F (36.6 C)  Max: 98.6 F (37 C)  BP 119/62 BP  Min: 115/72  Max: 151/85  HR 94 Pulse  Avg: 99.2  Min: 84  Max: 117  RR 16 Resp  Avg: 17.3  Min: 16  Max: 18  SaO2     No data recorded      Gen: alert, cooperative, no distress FHR: Baseline: 145 bpm, Variability: moderate, Accels: Present, Decels: none Toco: regular, every 1-4 minutes SVE: Dilation: 3 Effacement (%): 30, 40 Cervical Position: Posterior Station: Ballotable Presentation: Vertex Exam by:: [place] Guptill RN  Medications SCHEDULED MEDICATIONS   oxytocin 40 units in LR 1000 mL  333 mL Intravenous Once   prenatal multivitamin  1 tablet Oral Q1200    MEDICATION INFUSIONS   lactated ringers     lactated ringers 125 mL/hr at 05/19/23 0338   oxytocin     oxytocin 8 milli-units/min (05/19/23 0530)    PRN MEDICATIONS  acetaminophen, calcium carbonate, fentaNYL (SUBLIMAZE) injection, lactated ringers, lidocaine (PF), ondansetron, sodium citrate-citric acid, terbutaline   Assessment & Plan:  - cervical check unchanged x 10+ hours -patient has minimal to mild discomfort with contractions on 22 mil/u of Pitocin - stop Pitocin for a break -restart  low dose Pitocin at 0400    Chari Manning [facility]

## 2023-05-19 NOTE — Progress Notes (Signed)
Patient ID: Lynn Bean, female   DOB: [birth year], 37 y.o.   MRN: [id number] Pushing now 2.5 hrs and station still +2 station . Fetal head compressed against symphysis.  Given no progression and suspected LGA infant my recommendation is for a repeat LTCS . She declines BTL  The risks of cesarean section discussed with the patient included but were not limited to: bleeding which may require transfusion or reoperation; infection which may require antibiotics; injury to bowel, bladder, ureters or other surrounding organs; injury to the fetus; need for additional procedures including hysterectomy in the event of a life-threatening hemorrhage; placental abnormalities wth subsequent pregnancies, incisional problems, thromboembolic phenomenon and other postoperative/anesthesia complications. The patient concurred with the proposed plan, giving informed written consent for the procedure.   . Anesthesia and OR aware. Preoperative prophylactic antibiotics and SCDs ordered on call to the OR.  To OR when ready.

## 2023-05-19 NOTE — Progress Notes (Signed)
Lynn Bean is a 37 y.o. G2P0101 at [redacted]w[redacted]d by  admitted for IOL for gestational HTN   Day 2 induction  on pitocin 12 mu/min  Subjective: Ctx not painful  Objective: BP 136/87 (BP Location: Left Arm)   Pulse 90   Temp 98 F (36.7 C) (Oral)   Resp 18   Ht 5\' 6"  (1.676 m)   Wt 108.9 kg   LMP 09/01/2022   BMI 38.74 kg/m  I/O last 3 completed shifts: In: 600 [P.O.:600] Out: -  No intake/output data recorded.  FHT:  FHR: 135 bpm, variability: moderate,  accelerations:  Present,  decelerations:  Absent UC:   regular, every 2 minutes SVE:   Dilation: 2.5 Effacement (%): 30, 40 Station: Ballotable Exam by:: J Oxley CNM Exam by tjs  cx 1-2 cm / 50% / -3 AROM clear , IUPC placed  Labs: Lab Results  Component Value Date   WBC 15.1 (H) 05/19/2023   HGB 11.8 (L) 05/19/2023   HCT 36.2 05/19/2023   MCV 81.0 05/19/2023   PLT 222 05/19/2023    Assessment / Plan: TOLAC, LGA , gestational HTN  BP and labs stable , no Preeclampsia  Add pitocin to get adequate ctx pattern cle prn   Suzy Bouchard, MD 05/19/2023, 8:45 AM

## 2023-05-19 NOTE — Progress Notes (Signed)
Cervix check by me Anterior lip now slightly swollen. She has been 9.5 cm since 1553 Baby 8/12# 8 days ago 99% growth . I tried to have her push to try to reduce the cervix .Unsuccessful. Station +2  I will have CNM Oxley recheck at the four hour mark and cervix is still not reducible I will talk to her about repeat c/s

## 2023-05-19 NOTE — Progress Notes (Signed)
Patient ID: Lynn Bean, female   DOB: 09-Oct-1986, 37 y.o.   MRN: [id number] Mother with tachysystole with IUPC in place and IV pitocin . RN instructed to lower pitocin < 5ctx / 10 min

## 2023-05-19 NOTE — Discharge Summary (Signed)
Obstetrical Discharge Summary  Patient Name: Lynn Bean DOB: 1985/11/16 MRN: [id number]  Date of Admission: 05/16/2023 Date of Delivery:05/19/2023 at 2335 Delivered by: Beverly Gust MD Date of Discharge:05/21/23  Primary OB: [facility]  YQM:VHQIONG'E last menstrual period was 09/01/2022. EDC Estimated Date of Delivery: 06/08/23 Gestational Age at Delivery: [redacted]w[redacted]d   Antepartum complications: gest hypertension Admitting Diagnosis: gestational hypertension , AMA, fetus LGA, previous c/s desires TOLAC Secondary Diagnosis: Patient Active Problem List   Diagnosis Date Noted   Gestational HTN 05/17/2023   Hx of preeclampsia, prior pregnancy, currently pregnant, third trimester 05/04/2023   AMA (advanced maternal age) multigravida 35+ 05/04/2023   Previous cesarean delivery, antepartum 05/04/2023   LGA (large for gestational age) fetus affecting management of mother 05/04/2023   Obesity in pregnancy, antepartum 03/08/2023   Supervision of high risk pregnancy in second trimester 11/30/2022   Gestational hypertension 06/20/2019   Family history of spina bifida     Augmentation: AROM and Pitocin Complications: None Intrapartum complications/course: day 2 of IOL starting at 37 +0 week for gestational HTN . Labored and pushed for 2.5 hrs , progressing past +2 station  Date of Delivery: 05/21/2023  Delivered By: Beverly Gust MD Delivery Type: repeat cesarean section, low transverse incision Anesthesia: epidural Placenta: spontaneous Laceration:  Episiotomy: none Newborn Data: Live born newborn    female APGAR: 2/8 PPV  weight, 7/8# ABG: 7.06 / 123 co2 / bicarb 38  Newborn Delivery   Birth date/time:  Delivery type:        Postpartum Procedures:   Edinburgh:     05/20/2023    4:00 AM 07/02/2019    8:42 AM 07/01/2019    2:00 PM  Edinburgh Postnatal Depression Scale Screening Tool  I have been able to laugh and see the funny side of things. 0 0 --  I have  looked forward with enjoyment to things. 0 0   I have blamed myself unnecessarily when things went wrong. 0 0   I have been anxious or worried for no good reason. 0 0   I have felt scared or panicky for no good reason. 0 0   Things have been getting on top of me. 0 0   I have been so unhappy that I have had difficulty sleeping. 0 0   I have felt sad or miserable. 0 0   I have been so unhappy that I have been crying. 0 0   The thought of harming myself has occurred to me. 0 0   Edinburgh Postnatal Depression Scale Total 0 0     (Cesarean Section):  Patient had an uncomplicated postpartum course.  By time of discharge on POD#2, her pain was controlled on oral pain medications; she had appropriate lochia and was ambulating, voiding without difficulty, tolerating regular diet and passing flatus.   She was deemed stable for discharge to home.    Discharge Physical Exam:  BP 132/84 (BP Location: Right Arm)   Pulse 70   Temp 98.4 F (36.9 C) (Oral)   Resp 18   Ht 5\' 6"  (1.676 m)   Wt 108.9 kg   LMP 09/01/2022   SpO2 98% Comment: Room Air  Breastfeeding Unknown   BMI 38.74 kg/m   General: NAD CV: RRR Pulm: CTABL, nl effort ABD: s/nd/nt, fundus firm and below the umbilicus Lochia: moderate Incision: c/d/i DVT Evaluation: LE non-ttp, no evidence of DVT on exam.  Hemoglobin  Date Value Ref Range Status  05/20/2023 11.2 (L) 12.0 -  15.0 g/dL Final   HCT  Date Value Ref Range Status  05/20/2023 32.9 (L) 36.0 - 46.0 % Final     Disposition: stable, discharge to home. Baby Feeding: breastmilk Baby Disposition: home with mom  Rh Immune globulin given:  Rubella vaccine given:  Tdap vaccine given in AP or PP setting:  Flu vaccine given in AP or PP setting:   Risk assessment for postpartum VTE and prophylactic treatment: Very high risk factors: None High risk factors: Unscheduled cesarean after labor  Moderate risk factors: Cesarean delivery  and BMI 30-40 kg/m2  Postpartum  VTE prophylaxis with LMWH ordered  Contraception: vasectomy  Prenatal Labs:   Blood type/Rh B POS   Antibody screen Negative    Rubella Immune (02/13 0000)   Varicella Immune  RPR Nonreactive (07/10 0000)   HBsAg Negative (02/13 0000)  Hep C NR   HIV Non-reactive (07/10 0000)   GC neg  Chlamydia neg  Genetic screening cfDNA declined  1 hour GTT 94,106  3 hour GTT N/a  GBS Negative/-- (07/09 0000)     Plan:  Lynn Bean was discharged to home in good condition. Follow-up appointment with delivering provider in 6 weeks.  Discharge Medications: Allergies as of 05/21/2023       Reactions   Benadryl [diphenhydramine]    Only brand name benadryl        Medication List     STOP taking these medications    aspirin EC 81 MG tablet       TAKE these medications    acetaminophen 500 MG tablet Commonly known as: TYLENOL Take 2 tablets (1,000 mg total) by mouth every 6 (six) hours. What changed: when to take this   ascorbic acid 500 MG tablet Commonly known as: VITAMIN C Take 500 mg by mouth daily.   cholecalciferol 25 MCG (1000 UNIT) tablet Commonly known as: VITAMIN D3 Take 1,000 Units by mouth daily.   coconut oil Oil Apply 1 Application topically as needed.   dibucaine 1 % Oint Commonly known as: NUPERCAINAL Place 1 Application rectally as needed for hemorrhoids.   enoxaparin 40 MG/0.4ML injection Commonly known as: LOVENOX Inject 0.4 mLs (40 mg total) into the skin daily for 21 days.   ibuprofen 600 MG tablet Commonly known as: ADVIL Take 1 tablet (600 mg total) by mouth every 6 (six) hours.   magnesium 84 MG ( ) Tbcr SR tablet Commonly known as: MAGTAB Take 200 mg by mouth daily.   oxyCODONE 5 MG immediate release tablet Commonly known as: Oxy IR/ROXICODONE Take 1-2 tablets (5-10 mg total) by mouth every 4 (four) hours as needed for moderate pain.   prenatal multivitamin Tabs tablet Take 1 tablet by mouth daily at 12 noon.    senna-docusate 8.6-50 MG tablet Commonly known as: Senokot-S Take 2 tablets by mouth daily.   simethicone 80 MG chewable tablet Commonly known as: MYLICON Chew 1 tablet (80 mg total) by mouth as needed for flatulence.   witch hazel-glycerin pad Commonly known as: TUCKS Apply 1 Application topically as needed for hemorrhoids.         Follow-up Information     Schermerhorn, Ihor Austin, MD Follow up in 2 week(s).   Specialty: Obstetrics and Gynecology Why: post op check Contact information: [address] [place] [address] [phone]                 Signed: Chari Manning CNM

## 2023-05-19 NOTE — Progress Notes (Signed)
Labor Progress Note  Lynn Bean is a 37 y.o. G2P0101 at [redacted]w[redacted]d by LMP admitted for induction of labor due to Hypertension.  Subjective: Pt is starting to feel more contractions  Objective: BP 136/87 (BP Location: Left Arm)   Pulse 90   Temp 98 F (36.7 C) (Oral)   Resp 18   Ht 5\' 6"  (1.676 m)   Wt 108.9 kg   LMP 09/01/2022   BMI 38.74 kg/m    Fetal Assessment: FHT:  FHR: 135 bpm, variability: moderate,  accelerations:  Present,  decelerations:  Absent Category/reactivity:  Category I UC:   regular, every 2-4 minutes SVE:    Dilation: 2cm  Effacement: 30%  Station:  Floating  Consistency: medium  Position: posterior  Membrane status: Intact Amniotic color: n/a  Labs: Lab Results  Component Value Date   WBC 15.1 (H) 05/19/2023   HGB 11.8 (L) 05/19/2023   HCT 36.2 05/19/2023   MCV 81.0 05/19/2023   PLT 222 05/19/2023    Assessment / Plan: Induction of labor due to gestational hypertension,  progressing well on pitocin 2150 2.5/30;40/high 0735 Pitocin 14mU 0809 2.5/30;40/high   Labor: Progressing on Pitocin, will continue to increase then AROM Preeclampsia:   136/87 Fetal Wellbeing:  Category I Pain Control:  Labor support without medications I/D:   Afebrile, GBS neg, Intact Anticipated MOD:  NSVD  Lynn Bean, CNM 05/19/2023, 8:15 AM

## 2023-05-20 ENCOUNTER — Encounter: Payer: Self-pay | Admitting: Obstetrics and Gynecology

## 2023-05-20 ENCOUNTER — Other Ambulatory Visit: Payer: Self-pay

## 2023-05-20 LAB — CBC
HCT: 32.9 % — ABNORMAL LOW (ref 36.0–46.0)
Hemoglobin: 11.2 g/dL — ABNORMAL LOW (ref 12.0–15.0)
MCH: 27.7 pg (ref 26.0–34.0)
MCHC: 34 g/dL (ref 30.0–36.0)
MCV: 81.2 fL (ref 80.0–100.0)
Platelets: 209 10*3/uL (ref 150–400)
RBC: 4.05 MIL/uL (ref 3.87–5.11)
RDW: 14.5 % (ref 11.5–15.5)
WBC: 23.7 10*3/uL — ABNORMAL HIGH (ref 4.0–10.5)
nRBC: 0 % (ref 0.0–0.2)

## 2023-05-20 MED ORDER — MEPERIDINE HCL 25 MG/ML IJ SOLN: 6.2500 mg | INTRAMUSCULAR | Status: DC | PRN

## 2023-05-20 MED ORDER — COCONUT OIL OIL: 1.0000 | TOPICAL_OIL | Status: DC | PRN

## 2023-05-20 MED ORDER — OXYTOCIN-SODIUM CHLORIDE 30-0.9 UT/500ML-% IV SOLN: 2.5000 [IU]/h | INTRAVENOUS | Status: AC

## 2023-05-20 MED ORDER — SODIUM CHLORIDE 0.9% FLUSH: 3.0000 mL | INTRAVENOUS | Status: DC | PRN

## 2023-05-20 MED ORDER — ENOXAPARIN SODIUM 40 MG/0.4ML IJ SOSY
40.0000 mg | PREFILLED_SYRINGE | INTRAMUSCULAR | Status: DC
  Administered 2023-05-20: 40 mg via SUBCUTANEOUS
  Filled 2023-05-20: qty 0.4

## 2023-05-20 MED ORDER — PRENATAL MULTIVITAMIN CH: 1.0000 | ORAL_TABLET | Freq: Every day | ORAL | Status: DC

## 2023-05-20 MED ORDER — KETOROLAC TROMETHAMINE 30 MG/ML IJ SOLN: 30.0000 mg | Freq: Four times a day (QID) | INTRAMUSCULAR | Status: AC | PRN

## 2023-05-20 MED ORDER — ACETAMINOPHEN 500 MG PO TABS: 1000.0000 mg | ORAL_TABLET | Freq: Four times a day (QID) | ORAL | Status: DC

## 2023-05-20 MED ORDER — GABAPENTIN 100 MG PO CAPS
100.0000 mg | ORAL_CAPSULE | Freq: Every day | ORAL | Status: DC
  Administered 2023-05-20: 100 mg via ORAL
  Filled 2023-05-20 (×2): qty 1

## 2023-05-20 MED ORDER — NALOXONE HCL 4 MG/10ML IJ SOLN: 1.0000 ug/kg/h | INTRAVENOUS | Status: DC | PRN

## 2023-05-20 MED ORDER — ACETAMINOPHEN 500 MG PO TABS
1000.0000 mg | ORAL_TABLET | Freq: Four times a day (QID) | ORAL | Status: DC
  Administered 2023-05-20 – 2023-05-21 (×5): 1000 mg via ORAL
  Filled 2023-05-20 (×5): qty 2

## 2023-05-20 MED ORDER — SCOPOLAMINE 1 MG/3DAYS TD PT72: 1.0000 | MEDICATED_PATCH | Freq: Once | TRANSDERMAL | Status: DC

## 2023-05-20 MED ORDER — ZOLPIDEM TARTRATE 5 MG PO TABS: 5.0000 mg | ORAL_TABLET | Freq: Every evening | ORAL | Status: DC | PRN

## 2023-05-20 MED ORDER — ONDANSETRON HCL 4 MG/2ML IJ SOLN: 4.0000 mg | Freq: Three times a day (TID) | INTRAMUSCULAR | Status: DC | PRN

## 2023-05-20 MED ORDER — SENNOSIDES-DOCUSATE SODIUM 8.6-50 MG PO TABS
2.0000 | ORAL_TABLET | Freq: Every day | ORAL | Status: DC
  Administered 2023-05-20 – 2023-05-21 (×2): 2 via ORAL
  Filled 2023-05-20: qty 2

## 2023-05-20 MED ORDER — KETOROLAC TROMETHAMINE 30 MG/ML IJ SOLN
INTRAMUSCULAR | Status: DC | PRN
  Administered 2023-05-20: 30 mg via INTRAVENOUS

## 2023-05-20 MED ORDER — NALOXONE HCL 0.4 MG/ML IJ SOLN: 0.4000 mg | INTRAMUSCULAR | Status: DC | PRN

## 2023-05-20 MED ORDER — WITCH HAZEL-GLYCERIN EX PADS
1.0000 | MEDICATED_PAD | CUTANEOUS | Status: DC | PRN
  Filled 2023-05-20 (×2): qty 100

## 2023-05-20 MED ORDER — SENNOSIDES-DOCUSATE SODIUM 8.6-50 MG PO TABS: 2.0000 | ORAL_TABLET | Freq: Every day | ORAL | Status: DC

## 2023-05-20 MED ORDER — DIPHENHYDRAMINE HCL 25 MG PO CAPS: 25.0000 mg | ORAL_CAPSULE | ORAL | Status: DC | PRN

## 2023-05-20 MED ORDER — MENTHOL 3 MG MT LOZG: 1.0000 | LOZENGE | OROMUCOSAL | Status: DC | PRN

## 2023-05-20 MED ORDER — SIMETHICONE 80 MG PO CHEW
80.0000 mg | CHEWABLE_TABLET | Freq: Three times a day (TID) | ORAL | Status: DC
  Administered 2023-05-20 – 2023-05-21 (×5): 80 mg via ORAL
  Filled 2023-05-20 (×4): qty 1

## 2023-05-20 MED ORDER — SIMETHICONE 80 MG PO CHEW
80.0000 mg | CHEWABLE_TABLET | ORAL | Status: DC | PRN
  Administered 2023-05-20 (×2): 80 mg via ORAL
  Filled 2023-05-20 (×3): qty 1

## 2023-05-20 MED ORDER — SALINE SPRAY 0.65 % NA SOLN: 1.0000 | NASAL | Status: DC | PRN

## 2023-05-20 MED ORDER — IBUPROFEN 600 MG PO TABS
600.0000 mg | ORAL_TABLET | Freq: Four times a day (QID) | ORAL | Status: DC
  Administered 2023-05-21 (×2): 600 mg via ORAL
  Filled 2023-05-20 (×2): qty 1

## 2023-05-20 MED ORDER — DIBUCAINE (PERIANAL) 1 % EX OINT: 1.0000 | TOPICAL_OINTMENT | CUTANEOUS | Status: DC | PRN

## 2023-05-20 MED ORDER — KETOROLAC TROMETHAMINE 30 MG/ML IJ SOLN
30.0000 mg | Freq: Four times a day (QID) | INTRAMUSCULAR | Status: AC
  Administered 2023-05-20 (×4): 30 mg via INTRAVENOUS
  Filled 2023-05-20 (×4): qty 1

## 2023-05-20 MED ORDER — OXYCODONE HCL 5 MG PO TABS
5.0000 mg | ORAL_TABLET | ORAL | Status: DC | PRN
  Filled 2023-05-20: qty 1

## 2023-05-20 MED ORDER — DIPHENHYDRAMINE HCL 50 MG/ML IJ SOLN: 12.5000 mg | INTRAMUSCULAR | Status: DC | PRN

## 2023-05-20 MED ORDER — MORPHINE SULFATE (PF) 2 MG/ML IV SOLN: 1.0000 mg | INTRAVENOUS | Status: DC | PRN

## 2023-05-20 NOTE — Anesthesia Postprocedure Evaluation (Signed)
Anesthesia Post Note  Patient: Lynn Bean  Procedure(s) Performed: CESAREAN SECTION  Patient location during evaluation: Mother Baby Anesthesia Type: Epidural Level of consciousness: awake, awake and alert and oriented Pain management: pain level controlled Vital Signs Assessment: post-procedure vital signs reviewed and stable Respiratory status: spontaneous breathing and nonlabored ventilation Cardiovascular status: blood pressure returned to baseline and stable Postop Assessment: no headache and no backache Anesthetic complications: no  No notable events documented.   Last Vitals:  Vitals:   05/20/23 0430 05/20/23 0510  BP:  126/60  Pulse:  85  Resp:  18  Temp:  36.8 C  SpO2: 100% 97%    Last Pain:  Vitals:   05/20/23 0510  TempSrc: Oral  PainSc:                  [name]

## 2023-05-20 NOTE — Progress Notes (Signed)
Postop Day  1  Subjective: 37 y.o. U2V2536 postpartum day #1 status post repeat cesarean section following failed TOLAC. She  has not  ambulated this morning, is tolerating po.  Foley catheter remains in situ draining clear yellow urine. Her pain is well controlled on PO pain medications. Her lochia is less than menses.  Objective: BP 121/62 (BP Location: Left Arm)   Pulse 87   Temp 98.3 F (36.8 C) (Oral)   Resp 18   Ht 5\' 6"  (1.676 m)   Wt 108.9 kg   LMP 09/01/2022   SpO2 99%   Breastfeeding Unknown   BMI 38.74 kg/m    Physical Exam:  General: alert, cooperative, and no distress Breasts: soft/nontender Pulm: nl effort Abdomen: soft, non-tender, active bowel sounds Uterine Fundus: firm Incision: no significant drainage Perineum: minimal edema, intact Lochia: appropriate DVT Evaluation: No evidence of DVT seen on physical exam.  Recent Labs    05/19/23 0619 05/20/23 0631  HGB 11.8* 11.2*  HCT 36.2 32.9*  WBC 15.1* 23.7*  PLT 222 209    Assessment/Plan: 36 y.o. G2P1102 postpartum day # 1  1. Continue routine postpartum care  2. Infant feeding status: breast feeding -Lactation consult PRN for breastfeeding   3. Contraception plan:  TBD  4. CBC reviewed - maternal iron deficiency anemia in pregnancy - clinically significant.  -Hemodynamically stable and asymptomatic -Intervention: start on oral supplementation with ferrous sulfate 325 mg   5. Immunization status:   all immunizations up to date  6. Gestational hypertension  -BP's normal range postpartum   Disposition: continue inpatient postpartum care    LOS: 3 days   Gustavo Lah, Ina Homes 05/20/2023, 10:21 AM   ----- [name]  Certified Nurse Midwife [facility]

## 2023-05-20 NOTE — Transfer of Care (Signed)
Immediate Anesthesia Transfer of Care Note  Patient: Lynn Bean  Procedure(s) Performed: CESAREAN SECTION  Patient Location:  LDR 3  Anesthesia Type:Epidural  Level of Consciousness: awake  Airway & Oxygen Therapy: Patient Spontanous Breathing  Post-op Assessment: Report given to RN and Post -op Vital signs reviewed and stable  Post vital signs: Reviewed and stable  Last Vitals:  Vitals Value Taken Time  BP 115/69   Temp    Pulse    Resp    SpO2      Last Pain:  Vitals:   05/20/23 0035  TempSrc:   PainSc: 0-No pain      Patients Stated Pain Goal: 0 (05/19/23 1240)  Complications: No notable events documented.

## 2023-05-20 NOTE — Anesthesia Post-op Follow-up Note (Signed)
  Anesthesia Pain Follow-up Note  Patient: Lynn Bean  Day #:  1 Date of Follow-up: 05/20/2023 Time: 7:21 AM  Last Vitals:  Vitals:   05/20/23 0430 05/20/23 0510  BP:  126/60  Pulse:  85  Resp:  18  Temp:  36.8 C  SpO2: 100% 97%    Level of Consciousness: alert  Pain: none   Side Effects:None  Catheter Site Exam:clean, dry  Anti-Coag Meds (From admission, onward)    Start     Dose/Rate Route Frequency Ordered Stop   05/20/23 2200  enoxaparin (LOVENOX) injection 40 mg        40 mg Subcutaneous Every 24 hours 05/20/23 0257          Plan: D/C from anesthesia care at surgeon's request  [facility]

## 2023-05-20 NOTE — Brief Op Note (Signed)
05/16/2023 - 05/20/2023  12:26 AM  PATIENT:  Lynn Bean  37 y.o. female  PRE-OPERATIVE DIAGNOSIS:  arrest of descent , failed TOLAC , Cephalopelvic disproportion   POST-OPERATIVE DIAGNOSIS:same as above + . Female infant delivered  PROCEDURE:  Procedure(s): CESAREAN SECTION repeat LTCS   SURGEON:  Surgeons and Role:    * Emmerich Cryer, Ihor Austin, MD - Primary  PHYSICIAN ASSISTANT: Haroldine Laws , CNM   ASSISTANTS: cst   ANESTHESIA:   epidural  EBL:  qbl : 535 cc IOF 700 cc uo 75 cc  BLOOD ADMINISTERED:none  DRAINS: Urinary Catheter (Foley)   LOCAL MEDICATIONS USED:  MARCAINE     SPECIMEN:  No Specimen  DISPOSITION OF SPECIMEN:  N/A  COUNTS:  YES  TOURNIQUET:  * No tourniquets in log *  DICTATION: .Other Dictation: Dictation Number verbal  PLAN OF CARE: inpatient  PATIENT DISPOSITION:  PACU - hemodynamically stable.   Delay start of Pharmacological VTE agent (>24hrs) due to surgical blood loss or risk of bleeding: not applicable

## 2023-05-20 NOTE — Lactation Note (Signed)
This note was copied from a baby's chart. Lactation Consultation Note  Patient Name: Lynn Bean [id number] Date: 05/20/2023 Age:38 hours Reason for consult: Initial assessment;Term  Lactation to the room for initial visit. Mother is holding the baby on the right breast in cradle hold. States baby has been latching easily. Encouraged feeding on demand and with cues. If baby is not cueing encouraged hand expression and skin to skin. Mother states she has a 34yr old [name] hands free pump at home, but tested to make sure it was still working prior to baby's arrival. [employer] for [employer] to order a new pump through insurance.  Encouraged 8 or more attempts in the first 24 hours and 8 or more good feeds after 24 HOL. Reviewed appropriate diapers for days of life and How to know your baby is getting enough to eat. Reviewed "Understanding Postpartum and Newborn Care" booklet at bedside. [facility] # left on board, encouraged to call for any assistance. Mother has no further questions at this time.   Maternal Data Has patient been taught Hand Expression?: Yes Does the patient have breastfeeding experience prior to this delivery?: Yes How long did the patient breastfeed?: 6 mo  Feeding Mother's Current Feeding Choice: Breast Milk  LATCH Score Latch: Repeated attempts needed to sustain latch, nipple held in mouth throughout feeding, stimulation needed to elicit sucking reflex.  Audible Swallowing: Spontaneous and intermittent  Type of Nipple: Everted at rest and after stimulation  Comfort (Breast/Nipple): Soft / non-tender  Hold (Positioning): No assistance needed to correctly position infant at breast.  LATCH Score: 9   Interventions Interventions: Breast feeding basics reviewed;Education  Discharge Discharge Education: Engorgement and breast care;Warning signs for feeding baby Pump: Hands Free (has a 77 yr old willow, will order a new pump through insurance)  Consult Status Consult  Status: PRN    Naftula Donahue D Jayzen Paver 05/20/2023, 9:37 AM

## 2023-05-20 NOTE — Op Note (Signed)
NAME: Lynn Bean, PALS MEDICAL RECORD NO: [id number] ACCOUNT NO: [id number] DATE OF BIRTH: [birth year] FACILITY: [facility] LOCATION: [facility] PHYSICIAN: Suzy Bouchard, MD  Operative Report   DATE OF PROCEDURE: 05/19/2023  TIME:  2335  PREOPERATIVE DIAGNOSES: 1.  A 37 plus 1 weeks estimated gestational age. 2.  Gestational hypertension. 3.  Prior cesarean section, elects for a trial of labor after cesarean. 4.  Arrest of descent.  POSTOPERATIVE DIAGNOSES: 1.  A 37 plus 1 weeks estimated gestational age. 2.  Gestational hypertension. 3.  Prior cesarean section, elects for a trial of labor after cesarean. 4.  Arrest of descent. 5.  Cephalopelvic disproportion. 6.  Female infant delivered.  ANESTHESIA:  Surgical dosing of continuous lumbar epidural.  PROCEDURE:  Repeat low transverse cesarean section.  SURGEON:  Suzy Bouchard, MD  FIRST ASSISTANT:  Haroldine Laws, certified nurse midwife.  INDICATIONS:  This is a 37 year old gravida 2, para 1 patient, admitted at 37 weeks for gestational hypertension and induction of labor.  The patient has elected for a trial of labor after cesarean section.  The patient labored and was induced for 2 days  and ultimately did not progress past +2 station.  Cervix was completely dilated, completely effaced.  The patient was counseled regarding cesarean section and agreed to the procedure.  DESCRIPTION OF PROCEDURE:  After adequate surgical dosing of continuous lumbar epidural, the patient was placed in dorsal supine position.  The patient's abdomen was prepped and draped in normal sterile fashion.  Vaginal prep was performed.  The patient  did receive 500 mg azithromycin and 2 grams of Ancef intravenously for surgical prophylaxis.  Timeout was performed.  A Pfannenstiel incision was made 2 fingerbreadths above the symphysis pubis.  Sharp dissection was used to identify the fascia.  Fascia  was opened in the midline and opened in  transverse fashion.  Superior aspect of the fascia was grasped with Kocher clamps and the recti muscles were dissected free.  Inferior aspect was grasped with Kocher clamps and pyramidalis muscle was dissected  free.  Entry into the peritoneal cavity was accomplished sharply.  There were dense omental adhesions to the anterior abdominal wall, which were taken down meticulously.  The bladder was adhered to the lower uterine segment, which had to be taken down  sharply as well.  A low uterine incision was made.  Upon entry into the endometrial cavity, clear fluid resulted.  Extremely wedged fetal head was encountered and the nursing hand was used to elevate the head from the vaginal route and the head was then  brought to the incision and with fundal pressure, the head and body were delivered.  Floppy pale female infant was then quickly placed on the abdomen and cord was doubly clamped and the flaccid female was passed to nursery staff who performed positive  pressure ventilation and stimulation and warming.  Apgars 2 and 8.  Arterial blood gas 7.06, CO2 of 123 and bicarbonate of 38.  Of note, the certified nurse midwife, Haroldine Laws was utilized for retraction and fundal pressure for the procedure.  Her  assistance was mandatory for this procedure.  The placenta was manually delivered and the uterus was exteriorized.  The endometrial cavity was wiped clean with laparotomy tape and the uterine incision was then closed with  1-0 chromic suture, closed  in a running locking fashion.  Three additional figure-of-eight sutures were required for good hemostasis.  Fallopian tubes and ovaries appeared normal.  Posterior cul-de-sac was irrigated and suctioned  and the uterus was placed back into the abdominal  cavity and the paracolic gutters were wiped clean with laparotomy tape.  Uterine incision again appeared hemostatic and the fascia was then closed with 0 Vicryl suture in a running nonlocking fashion, two separate  sutures used.  Fascial edges were  injected with a solution of 0.5% Marcaine 60 mL plus 20 mL normal saline.  30 mL of this solution was injected into the fascial edge.  Subcutaneous tissues were irrigated and bovied for hemostasis and given the depth of the subcutaneous tissue of 2.5 cm,  the subcutaneous dead space was closed with a running 2-0 chromic suture.  The skin was reapproximated with Insorb absorbable staples.  Two additional 4-0 Monocryl sutures were used in the right lateral aspect to reapproximate the skin more  cosmetically.  30 mL of additional Marcaine solution was injected beneath the skin.  There were no complications.  QUANTITATIVE BLOOD LOSS:  535 mL  INTRAOPERATIVE FLUIDS:  700 mL  URINE OUTPUT:  75 mL  The patient did receive 30 mg intravenous Toradol at the end of the procedure.  Sterile dressing was applied.  DISPOSITION:  The patient was taken to recovery room in good condition.   SHW D: 05/20/2023 12:44:37 am T: 05/20/2023 1:08:00 am  JOB: [id number]/ [id number]

## 2023-05-20 NOTE — Plan of Care (Signed)
Patient progressing well--ambulating, breast feeding, and pain well controlled with scheduled medication. Continue to assess.

## 2023-05-21 LAB — CBC
HCT: 34 % — ABNORMAL LOW (ref 36.0–46.0)
Hemoglobin: 11.2 g/dL — ABNORMAL LOW (ref 12.0–15.0)
MCH: 27 pg (ref 26.0–34.0)
MCHC: 32.9 g/dL (ref 30.0–36.0)
MCV: 81.9 fL (ref 80.0–100.0)
Platelets: 205 10*3/uL (ref 150–400)
RBC: 4.15 MIL/uL (ref 3.87–5.11)
RDW: 14.7 % (ref 11.5–15.5)
WBC: 18.3 10*3/uL — ABNORMAL HIGH (ref 4.0–10.5)
nRBC: 0 % (ref 0.0–0.2)

## 2023-05-21 MED ORDER — SENNOSIDES-DOCUSATE SODIUM 8.6-50 MG PO TABS: 2.0000 | ORAL_TABLET | Freq: Every day | ORAL | 0 refills | Status: AC

## 2023-05-21 MED ORDER — SIMETHICONE 80 MG PO CHEW: 80.0000 mg | CHEWABLE_TABLET | ORAL | 0 refills | Status: AC | PRN

## 2023-05-21 MED ORDER — ACETAMINOPHEN 500 MG PO TABS: 1000.0000 mg | ORAL_TABLET | Freq: Four times a day (QID) | ORAL | Status: AC

## 2023-05-21 MED ORDER — OXYCODONE HCL 5 MG PO TABS: 5.0000 mg | ORAL_TABLET | ORAL | 0 refills | Status: AC | PRN

## 2023-05-21 MED ORDER — WITCH HAZEL-GLYCERIN EX PADS: 1.0000 | MEDICATED_PAD | CUTANEOUS | 12 refills | Status: AC | PRN

## 2023-05-21 MED ORDER — COCONUT OIL OIL: 1.0000 | TOPICAL_OIL | Status: AC | PRN

## 2023-05-21 MED ORDER — IBUPROFEN 600 MG PO TABS: 600.0000 mg | ORAL_TABLET | Freq: Four times a day (QID) | ORAL | 0 refills | Status: AC

## 2023-05-21 MED ORDER — DIBUCAINE (PERIANAL) 1 % EX OINT: 1.0000 | TOPICAL_OINTMENT | CUTANEOUS | 0 refills | Status: AC | PRN

## 2023-05-21 MED ORDER — ENOXAPARIN SODIUM 40 MG/0.4ML IJ SOSY: 40.0000 mg | PREFILLED_SYRINGE | INTRAMUSCULAR | 0 refills | Status: AC

## 2023-05-21 NOTE — Progress Notes (Signed)
Patient discharged home with family.  Discharge instructions, when to follow up, and prescriptions reviewed with patient.  Patient verbalized understanding. Patient will be escorted out by auxiliary.   

## 2023-05-21 NOTE — Discharge Instructions (Signed)

## 2023-05-26 ENCOUNTER — Telehealth: Payer: Self-pay

## 2023-05-26 NOTE — Telephone Encounter (Signed)
[facility]- Discharge Call Backs-Left Voicemail about the following below. 1-Do you have any questions or concerns about yourself as you heal? 2-Any concerns or questions about your baby? 3- Reviewed ABC's of safe sleep. 4-How was your stay at the hospital? 5- Did our team work together to care for you? You should be receiving a survey in the mail soon.   We would really appreciate it if you could fill that out for us and return it in the mail.  We value the feedback to make improvements and continue the great work we do.   If you have any questions please feel free to call me back at [phone]
